# Patient Record
Sex: Male | Born: 1980 | Race: White | Hispanic: No | Marital: Single | Smoking: Current every day smoker
Health system: Southern US, Community
[De-identification: ages and names within clinical notes are randomized; demographics above are authoritative.]

## PROBLEM LIST (undated history)

## (undated) DIAGNOSIS — F79 Unspecified intellectual disabilities: Secondary | ICD-10-CM

## (undated) DIAGNOSIS — E119 Type 2 diabetes mellitus without complications: Secondary | ICD-10-CM

## (undated) DIAGNOSIS — F259 Schizoaffective disorder, unspecified: Secondary | ICD-10-CM

## (undated) HISTORY — PX: APPENDECTOMY: SHX54

---

## 2007-12-18 ENCOUNTER — Emergency Department (HOSPITAL_COMMUNITY): Admission: EM | Admit: 2007-12-18 | Discharge: 2007-12-18 | Payer: Self-pay | Admitting: Emergency Medicine

## 2008-05-21 ENCOUNTER — Emergency Department: Payer: Self-pay | Admitting: Emergency Medicine

## 2008-05-21 ENCOUNTER — Other Ambulatory Visit: Payer: Self-pay

## 2008-05-23 ENCOUNTER — Inpatient Hospital Stay: Payer: Self-pay | Admitting: Psychiatry

## 2008-05-25 ENCOUNTER — Other Ambulatory Visit: Payer: Self-pay

## 2008-06-06 ENCOUNTER — Other Ambulatory Visit: Payer: Self-pay

## 2008-06-06 ENCOUNTER — Emergency Department: Payer: Self-pay | Admitting: Emergency Medicine

## 2008-06-08 ENCOUNTER — Emergency Department: Payer: Self-pay | Admitting: Emergency Medicine

## 2008-06-08 ENCOUNTER — Other Ambulatory Visit: Payer: Self-pay

## 2008-06-16 ENCOUNTER — Other Ambulatory Visit: Payer: Self-pay

## 2008-06-16 ENCOUNTER — Emergency Department: Payer: Self-pay | Admitting: Emergency Medicine

## 2009-05-05 IMAGING — CR DG CHEST 2V
2 series · 2 of 2 positions shown · non-contrast
Comparison: None.

CLINICAL DATA: 26-YEAR-OLD MALE COUGH, CONGESTION, FEVER, SHORTNESS
OF BREATH

CHEST - 2 VIEW

[w chest pa]
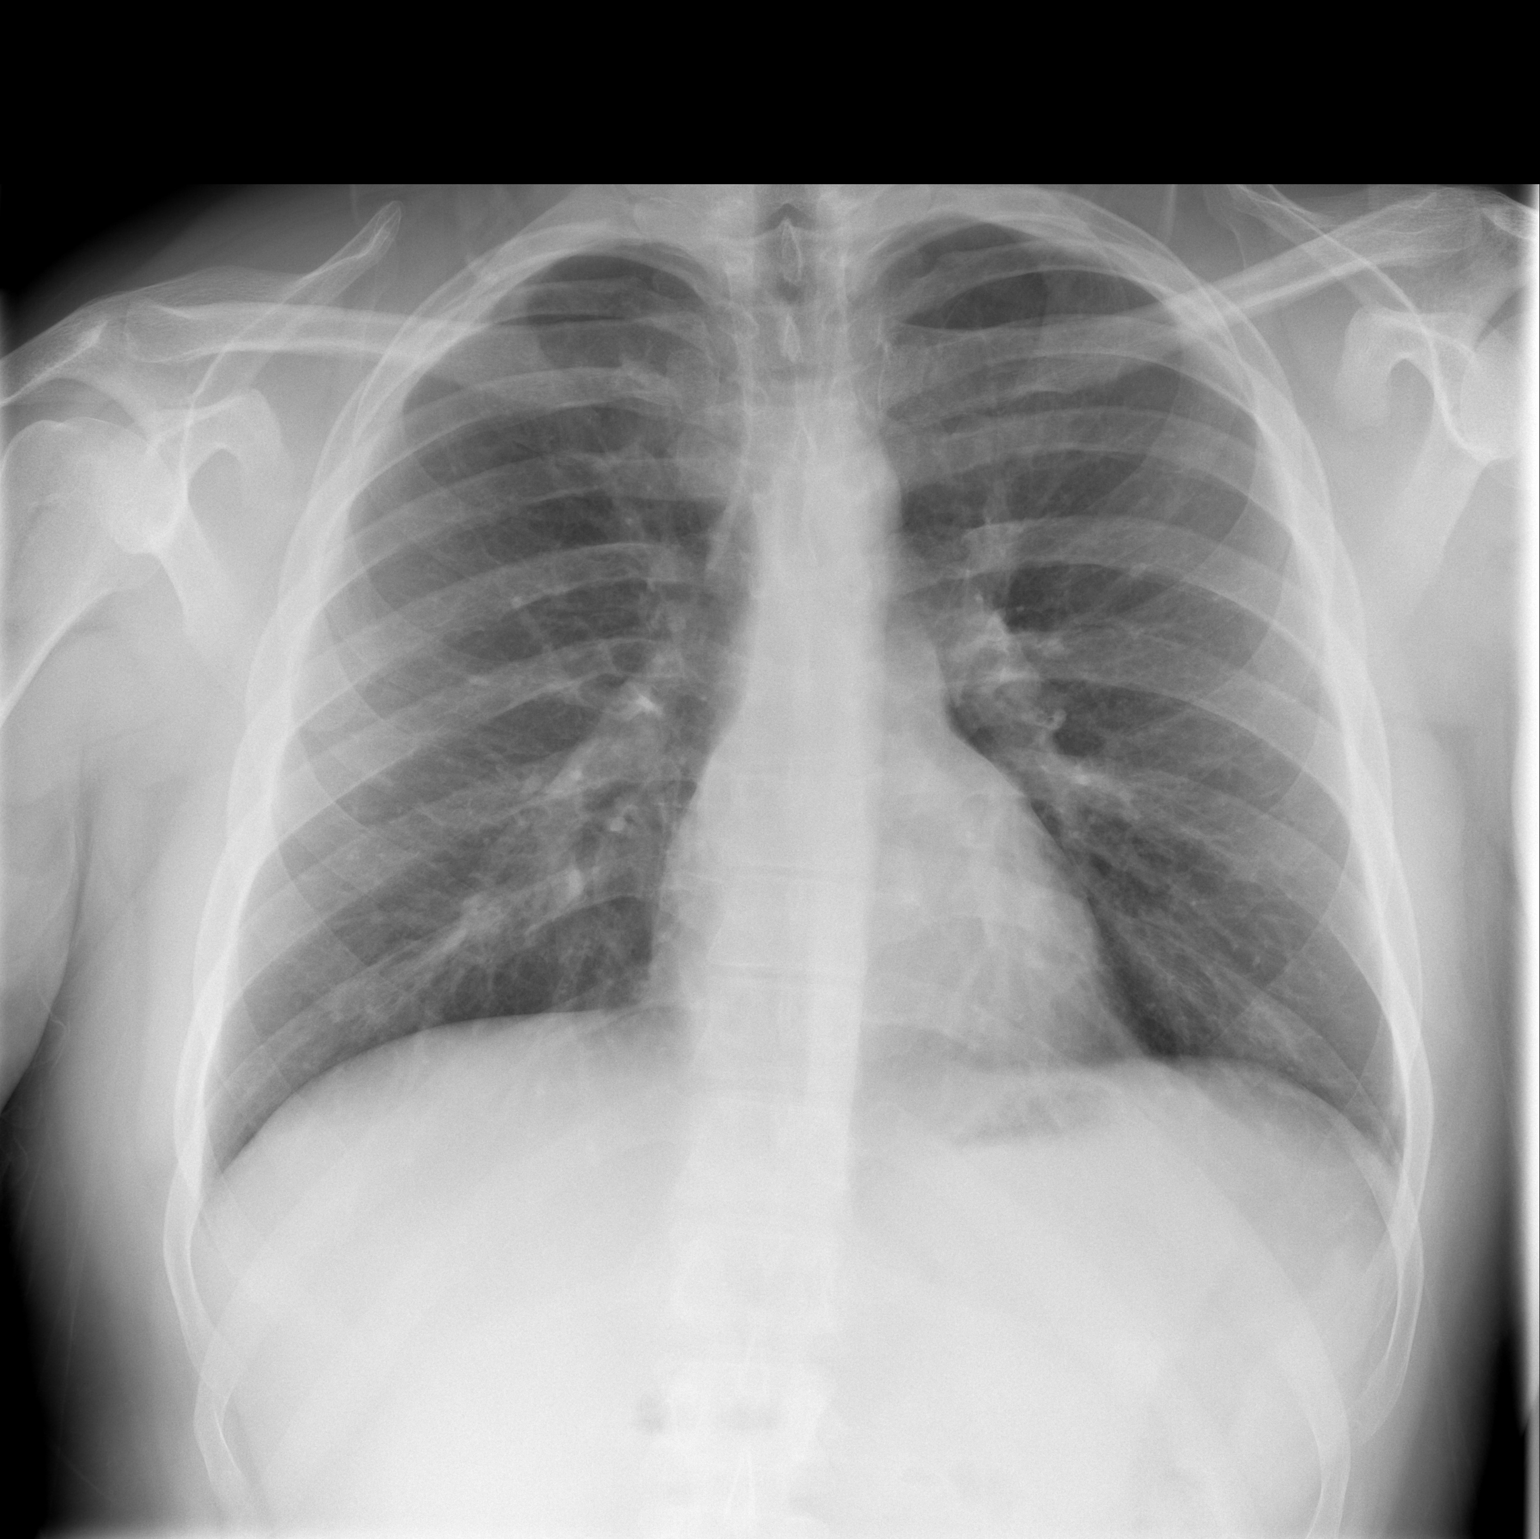

[w chest lat]
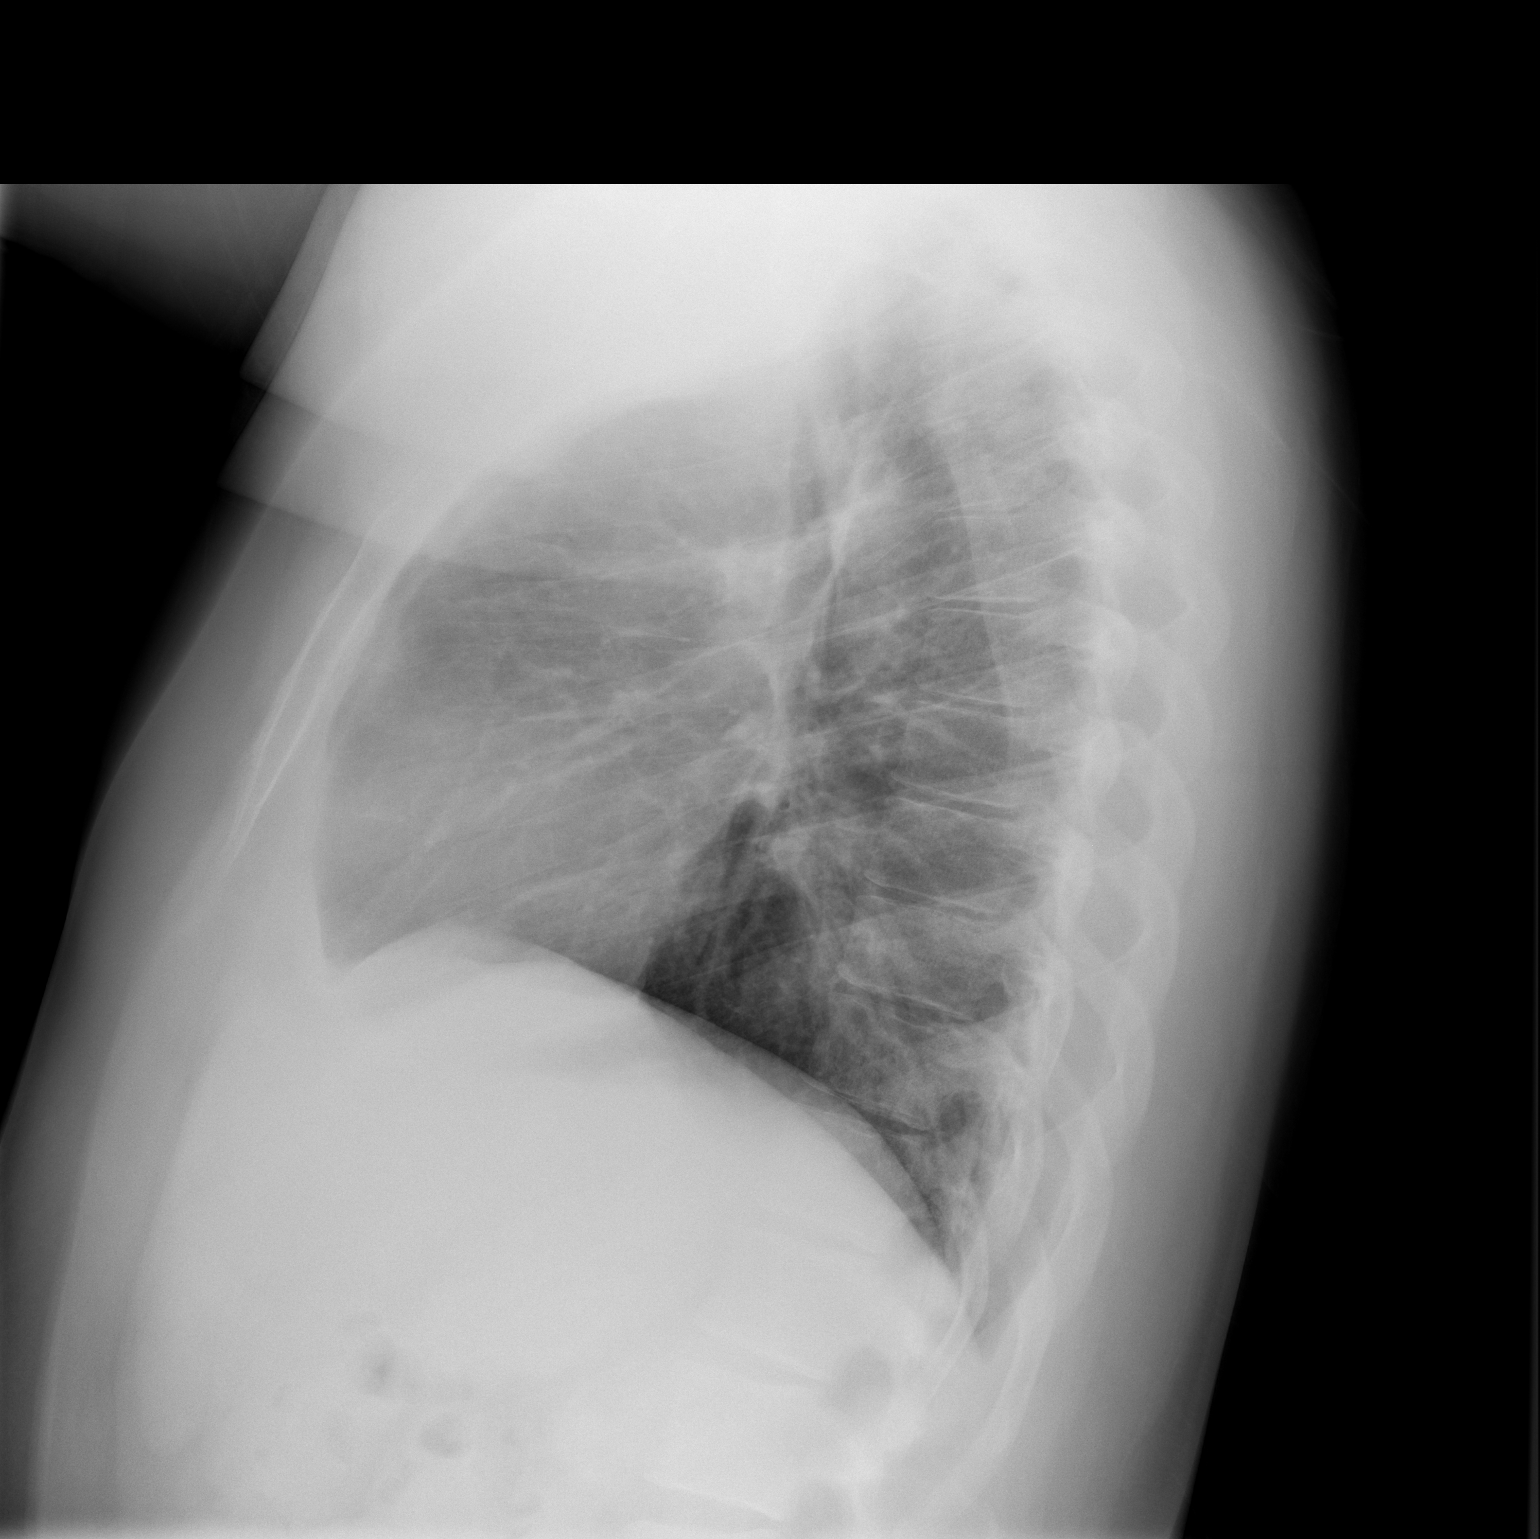

[2 of 2 positions shown; findings below may reference images not displayed]

FINDINGS: No focal pneumonia, edema, effusion pneumothorax.  Normal
heart size.  Trachea is midline.  There is a 10 mm right lower lobe
nodular density.  This may represent superimposed shadows from
pulmonary vascularity and ribs.  Comparison with any prior studies
would be beneficial versus interval follow-up in 2-3 months.
IMPRESSION: No acute chest process.

Right lower lobe 10 mm nodular density suspect superimposed
shadows, less likely nodule.  See above comment.

## 2009-10-07 IMAGING — CR DG CHEST 2V
1 series · 2 of 2 positions shown · non-contrast
Comparison: none

REASON FOR EXAM: cp, shortness of breath...pt in wr
COMMENTS:   LMP: (Male)

PROCEDURE:     DXR - DXR CHEST PA (OR [facility]) AND LATERAL  - May 21, 2008  [DATE]
RESULT:     The lungs are adequately inflated. There is no focal infiltrate
or evidence of a pleural effusion. The heart and mediastinal structures are
within the limits of normal.

[Series 1: view not recorded · 0.17mm/px · 2 of 2 slices shown]
[im 1/2]
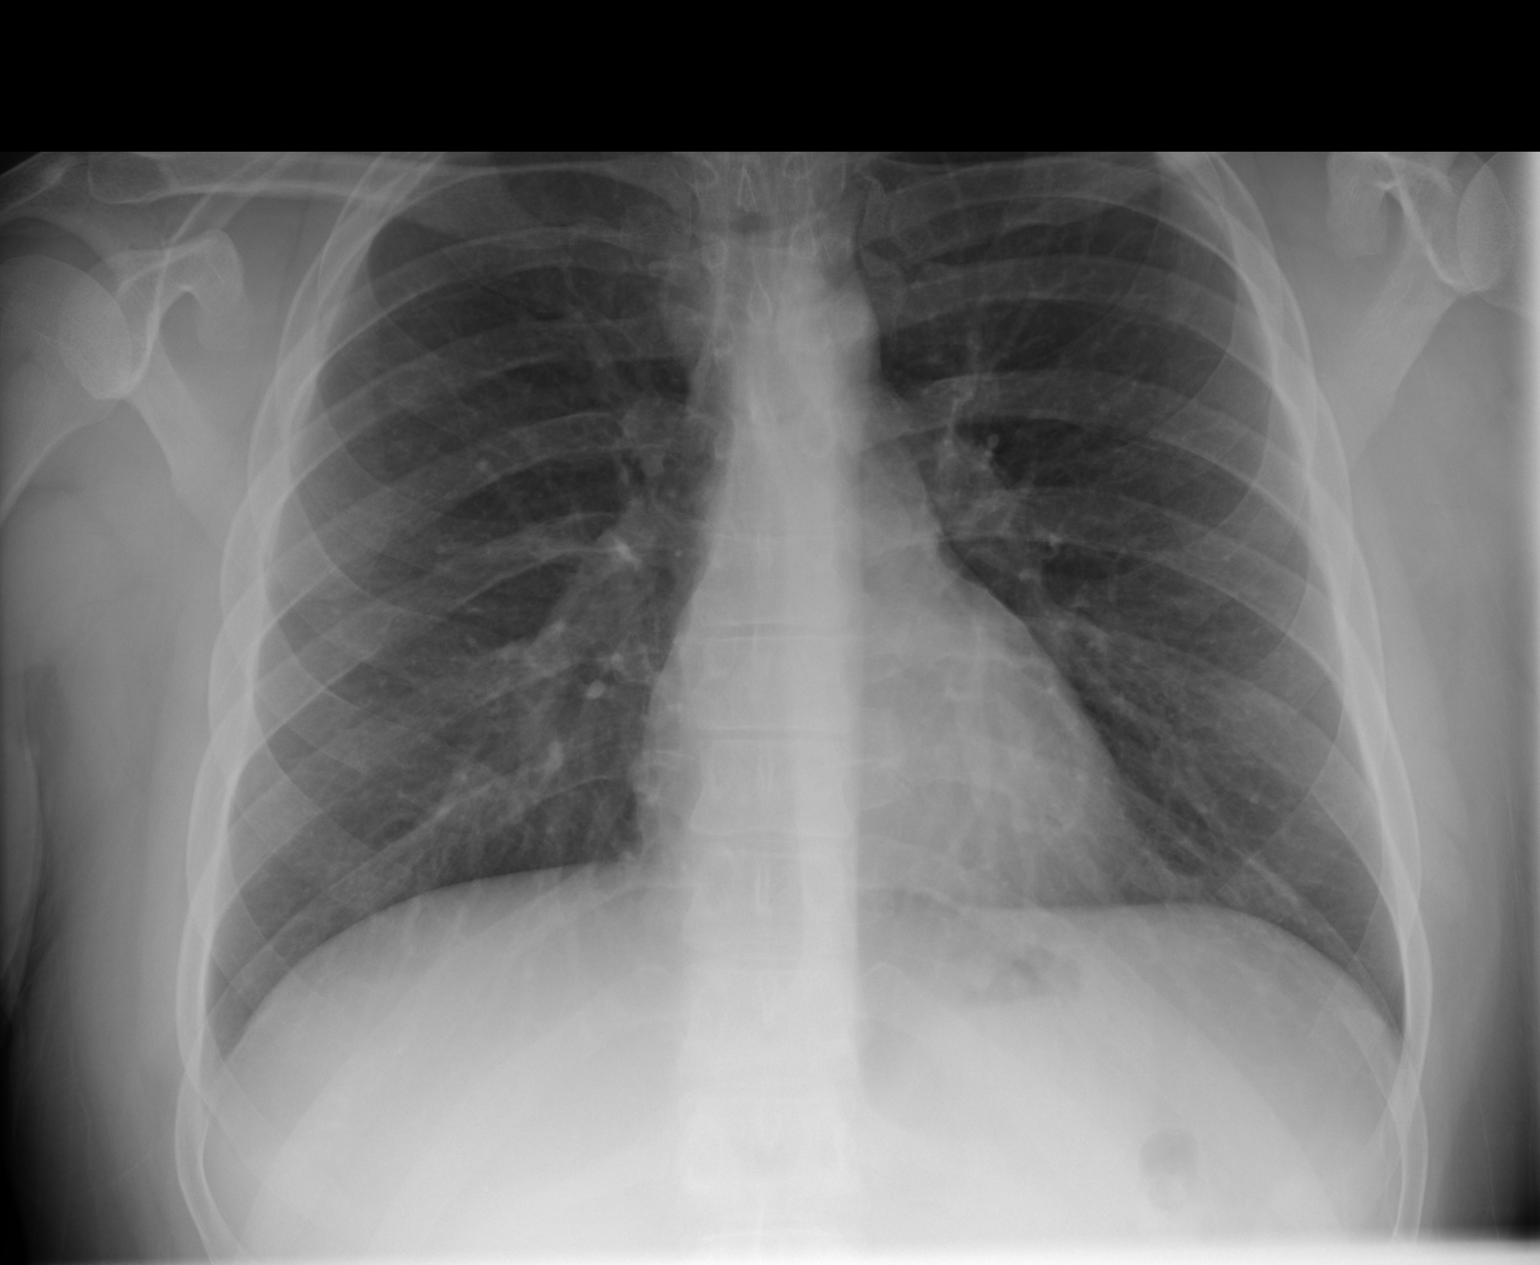
[im 2/2]
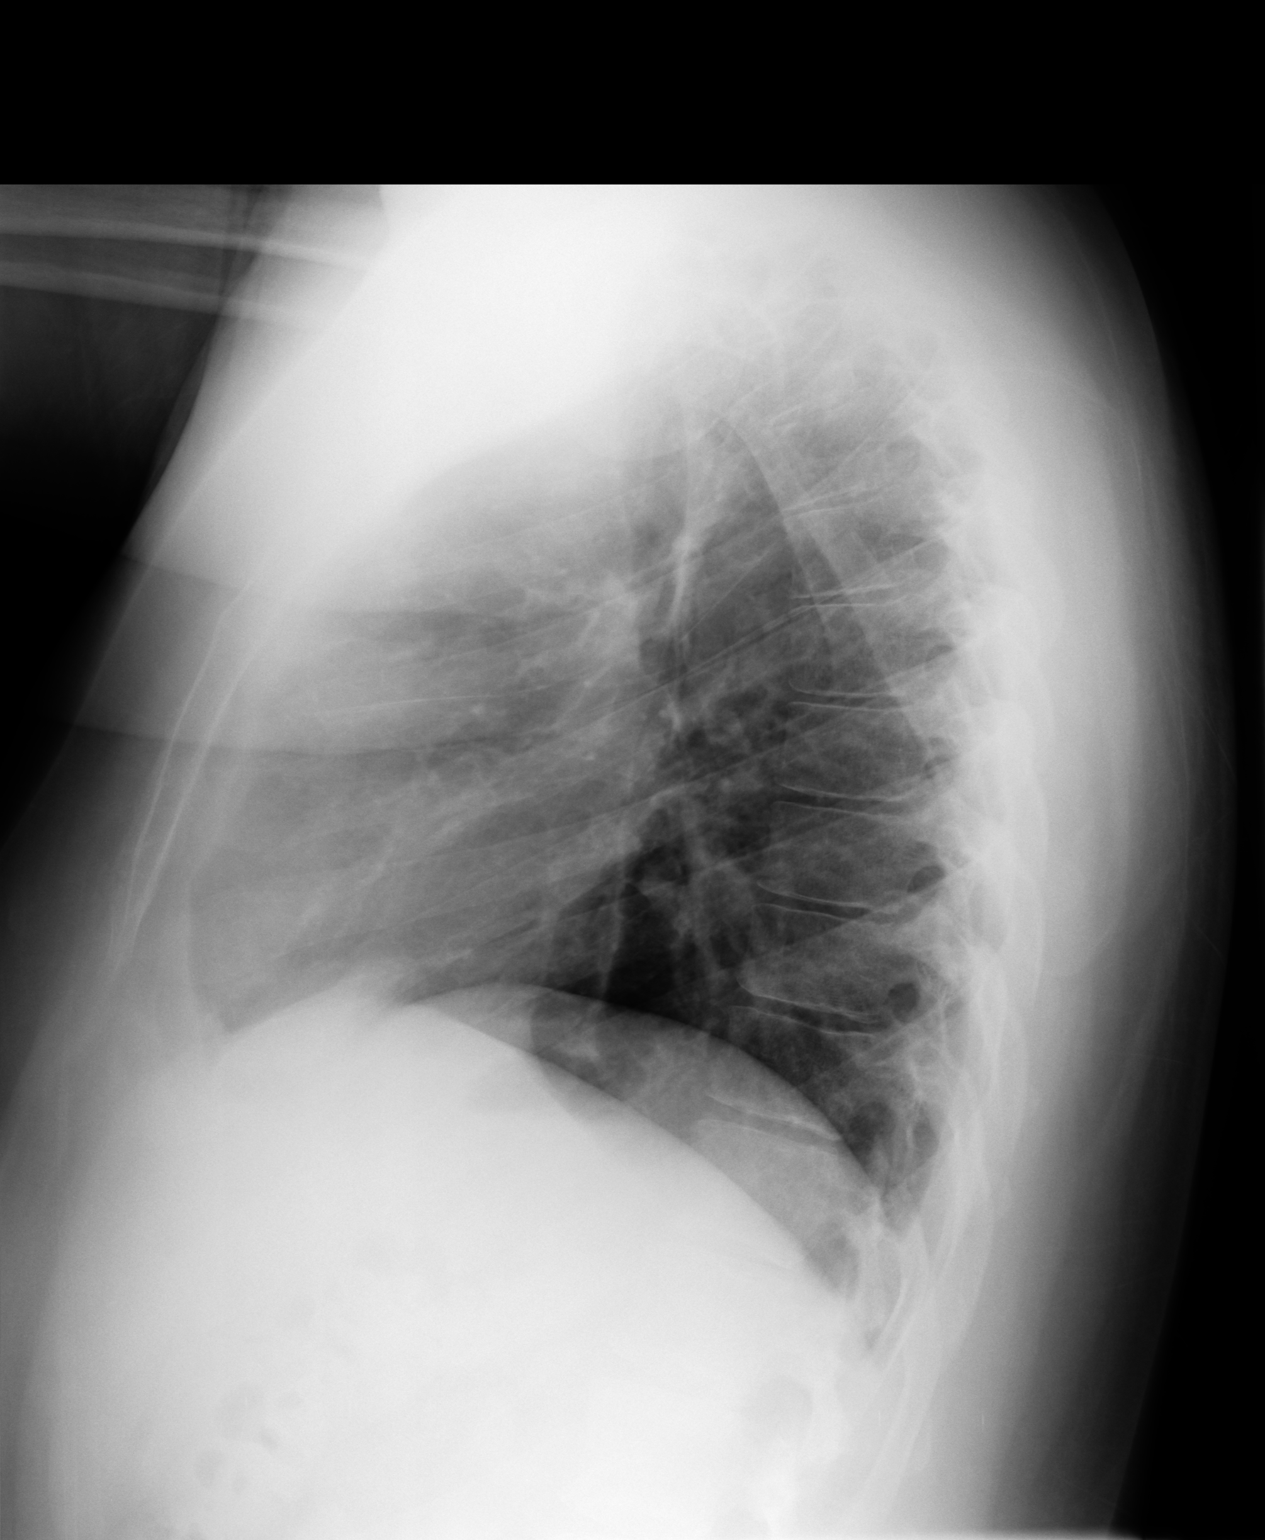

[2 of 2 positions shown; findings below may reference images not displayed]

IMPRESSION: I see no evidence of pneumonia nor CHF or other acute
cardiopulmonary abnormality. Follow-up imaging is available upon request.

## 2016-05-11 ENCOUNTER — Emergency Department (HOSPITAL_COMMUNITY)
Admission: EM | Admit: 2016-05-11 | Discharge: 2016-05-12 | Disposition: A | Discharge status: Home / Self Care | Payer: Medicare Other | Attending: Emergency Medicine | Admitting: Emergency Medicine

## 2016-05-11 ENCOUNTER — Encounter (HOSPITAL_COMMUNITY): Payer: Self-pay | Admitting: Cardiology

## 2016-05-11 ENCOUNTER — Emergency Department (HOSPITAL_COMMUNITY): Payer: Medicare Other

## 2016-05-11 DIAGNOSIS — Y939 Activity, unspecified: Secondary | ICD-10-CM | POA: Insufficient documentation

## 2016-05-11 DIAGNOSIS — W2209XA Striking against other stationary object, initial encounter: Secondary | ICD-10-CM | POA: Insufficient documentation

## 2016-05-11 DIAGNOSIS — R45851 Suicidal ideations: Secondary | ICD-10-CM | POA: Diagnosis not present

## 2016-05-11 DIAGNOSIS — S40022A Contusion of left upper arm, initial encounter: Secondary | ICD-10-CM | POA: Insufficient documentation

## 2016-05-11 DIAGNOSIS — R451 Restlessness and agitation: Secondary | ICD-10-CM | POA: Insufficient documentation

## 2016-05-11 DIAGNOSIS — Z5181 Encounter for therapeutic drug level monitoring: Secondary | ICD-10-CM | POA: Diagnosis not present

## 2016-05-11 DIAGNOSIS — E119 Type 2 diabetes mellitus without complications: Secondary | ICD-10-CM | POA: Diagnosis not present

## 2016-05-11 DIAGNOSIS — Y929 Unspecified place or not applicable: Secondary | ICD-10-CM | POA: Diagnosis not present

## 2016-05-11 DIAGNOSIS — Y999 Unspecified external cause status: Secondary | ICD-10-CM | POA: Insufficient documentation

## 2016-05-11 DIAGNOSIS — F17200 Nicotine dependence, unspecified, uncomplicated: Secondary | ICD-10-CM | POA: Diagnosis not present

## 2016-05-11 DIAGNOSIS — S4992XA Unspecified injury of left shoulder and upper arm, initial encounter: Secondary | ICD-10-CM | POA: Diagnosis present

## 2016-05-11 HISTORY — DX: Type 2 diabetes mellitus without complications: E11.9

## 2016-05-11 HISTORY — DX: Schizoaffective disorder, unspecified: F25.9

## 2016-05-11 HISTORY — DX: Unspecified intellectual disabilities: F79

## 2016-05-11 LAB — RAPID URINE DRUG SCREEN, HOSP PERFORMED
AMPHETAMINES: NOT DETECTED
BARBITURATES: NOT DETECTED
BENZODIAZEPINES: NOT DETECTED
COCAINE: NOT DETECTED
Opiates: NOT DETECTED
TETRAHYDROCANNABINOL: NOT DETECTED

## 2016-05-11 LAB — BASIC METABOLIC PANEL
Anion gap: 6 (ref 5–15)
BUN: 23 mg/dL — AB (ref 6–20)
CHLORIDE: 105 mmol/L (ref 101–111)
CO2: 27 mmol/L (ref 22–32)
Calcium: 8.9 mg/dL (ref 8.9–10.3)
Creatinine, Ser: 1.76 mg/dL — ABNORMAL HIGH (ref 0.61–1.24)
GFR calc non Af Amer: 49 mL/min — ABNORMAL LOW (ref >60)
GFR, EST AFRICAN AMERICAN: 57 mL/min — AB (ref >60)
Glucose, Bld: 107 mg/dL — ABNORMAL HIGH (ref 65–99)
POTASSIUM: 3.6 mmol/L (ref 3.5–5.1)
SODIUM: 138 mmol/L (ref 135–145)

## 2016-05-11 LAB — CBC
HCT: 38.3 % — ABNORMAL LOW (ref 39.0–52.0)
HEMOGLOBIN: 12.7 g/dL — AB (ref 13.0–17.0)
MCH: 30.2 pg (ref 26.0–34.0)
MCHC: 33.2 g/dL (ref 30.0–36.0)
MCV: 91.2 fL (ref 78.0–100.0)
Platelets: 218 10*3/uL (ref 150–400)
RBC: 4.2 MIL/uL — AB (ref 4.22–5.81)
RDW: 14.7 % (ref 11.5–15.5)
WBC: 7.1 10*3/uL (ref 4.0–10.5)

## 2016-05-11 LAB — ACETAMINOPHEN LEVEL

## 2016-05-11 LAB — ETHANOL

## 2016-05-11 MED ORDER — IBUPROFEN 400 MG PO TABS
600.0000 mg | ORAL_TABLET | Freq: Once | ORAL | Status: AC
  Administered 2016-05-11: 600 mg via ORAL
  Filled 2016-05-11: qty 2

## 2016-05-11 NOTE — ED Notes (Addendum)
Meal given. ACE wrap applied to left hand/wrist

## 2016-05-11 NOTE — BH Assessment (Signed)
Tele Assessment Note   Jordan Gray is an 35 y.o. male who came to the Emergency Department after having an outburst at his group home and "punching a brick wall". He states that he has been feeling depress and suicidal the past 2 days because he "misses his family". He states that he lives in a group home and just started living there 2 weeks ago. He states that if he goes back tonight then he will "cut his throat and his wrists". He states that he has been hearing voices telling him to kill himself. He states that he is taking medications as prescribed. He has been admitted inpatient in the past for A/V hallucinations and SI. [place] recent [place] in [place] 2 weeks ago. He denies using any substances or having a substance abuse problem or history.   Clinician spoke to group home owner (his guardian) Waldemar DickensJeff Womack who states that he feels that pt might have been seeking attention today. He states that a patient at the group home last night had a similar outburst and he was brought to the hospital so he isn't sure if this triggered Mr. Burnett ShengHedrick or not. He states that he has not seen any aggressive behavior from the pt before today and does feel like he is a danger to himself at the moment. He states that he is able to take him back to the group home once he is cleared psychiatrically.  Waldemar DickensJeff Womack- [phone]- [phone]  Per Malachy Chamberakia Starkes NP pt will need to be observed overnight and reevaluated in the AM  Diagnosis: Schizophrenia, Depression unspecified   Past Medical History:  Past Medical History:  Diagnosis Date  . Diabetes mellitus without complication (HCC)   . MR (mental retardation)   . Schizoaffective disorder (HCC)     History reviewed. No pertinent surgical history.  Family History: History reviewed. No pertinent family history.  Social History:  reports that he has been smoking.  He has never used smokeless tobacco. He reports that he does not drink alcohol or use  drugs.  Additional Social History:  Alcohol / Drug Use History of alcohol / drug use?: No history of alcohol / drug abuse  CIWA: CIWA-Ar BP: 136/81 Pulse Rate: 95 COWS:    PATIENT STRENGTHS: (choose at least two) Communication skills General fund of knowledge  Allergies:  Allergies  Allergen Reactions  . Bactrim [Sulfamethoxazole-Trimethoprim]   . Latex   . Other     Nicotine patch  . Tomato     Home Medications:  (Not in a hospital admission)  OB/GYN Status:  No LMP for male patient.  General Assessment Data Location of Assessment: [facility] ED TTS Assessment: In system Is this a Tele or Face-to-Face Assessment?: Tele Assessment Is this an Initial Assessment or a Re-assessment for this encounter?: Initial Assessment Marital status: Single Maiden name: n Is patient pregnant?: No Pregnancy Status: No Living Arrangements: Group Home Can pt return to current living arrangement?: Yes Admission Status: Voluntary Is patient capable of signing voluntary admission?: Yes Referral Source: Self/Family/Friend Insurance type: Medicare     Crisis Care Plan Living Arrangements: Group Home Legal Guardian:  Waldemar Dickens(Jeff Womack [phone]) Name of Psychiatrist: None (just moved to the area) Name of Therapist: None (just moved to the area)  Education Status Is patient currently in school?: No Highest grade of school patient has completed: 12th  Risk to self with the past 6 months Suicidal Ideation: Yes-Currently Present Has patient been a risk to self within the past  6 months prior to admission? : Yes Suicidal Intent: Yes-Currently Present Has patient had any suicidal intent within the past 6 months prior to admission? : Yes Is patient at risk for suicide?: Yes Suicidal Plan?: Yes-Currently Present Has patient had any suicidal plan within the past 6 months prior to admission? : Yes Specify Current Suicidal Plan: cut his throat and wrist Access to Means: No What has been your use  of drugs/alcohol within the last 12 months?: no use Previous Attempts/Gestures: Yes How many times?: 1 Other Self Harm Risks: N/A  Triggers for Past Attempts: Unknown Intentional Self Injurious Behavior: Cutting Comment - Self Injurious Behavior: history of cutting Family Suicide History: No Recent stressful life event(s): Conflict (Comment) (Moved to a new group home) Persecutory voices/beliefs?: Yes Depression: Yes Depression Symptoms: Despondent, Loss of interest in usual pleasures, Feeling worthless/self pity Substance abuse history and/or treatment for substance abuse?: No Suicide prevention information given to non-admitted patients: Not applicable  Risk to Others within the past 6 months Homicidal Ideation: No Does patient have any lifetime risk of violence toward others beyond the six months prior to admission? : No Thoughts of Harm to Others: No Current Homicidal Intent: No Current Homicidal Plan: No Access to Homicidal Means: No Identified Victim: none History of harm to others?: No Assessment of Violence: On admission Violent Behavior Description: punched a brick wall Does patient have access to weapons?: No Criminal Charges Pending?: No Does patient have a court date: No Is patient on probation?: No  Psychosis Hallucinations: Auditory Delusions: None noted  Mental Status Report Appearance/Hygiene: Disheveled Eye Contact: Fair Motor Activity: Freedom of movement Speech: Logical/coherent Level of Consciousness: Alert Mood: Depressed Affect: Appropriate to circumstance Anxiety Level: Moderate Thought Processes: Coherent Judgement: Partial Orientation: Person, Place, Time, Situation Obsessive Compulsive Thoughts/Behaviors: Moderate  Cognitive Functioning Concentration: Poor Memory: Recent Intact, Remote Intact IQ: Below Average Level of Function: 55 Insight: Poor Impulse Control: Poor Appetite: Fair Weight Loss: 0 Weight Gain: 0 Sleep:  Decreased Vegetative Symptoms: None  ADLScreening [facility] Assessment Services) Patient's cognitive ability adequate to safely complete daily activities?: Yes Patient able to express need for assistance with ADLs?: Yes Independently performs ADLs?: Yes (appropriate for developmental age)  Prior Inpatient Therapy Prior Inpatient Therapy: Yes Prior Therapy Dates: unknown (last one was 2 weeks ago) Prior Therapy Facilty/Provider(s): [place]  Reason for Treatment: Depression, Schizophrenia   Prior Outpatient Therapy Prior Outpatient Therapy:  (Unknown) Does patient have an ACCT team?: No Does patient have Intensive In-House Services?  : No Does patient have [facility] services? : No Does patient have P4CC services?: No  ADL Screening (condition at time of admission) Patient's cognitive ability adequate to safely complete daily activities?: Yes Is the patient deaf or have difficulty hearing?: No Does the patient have difficulty seeing, even when wearing glasses/contacts?: No Does the patient have difficulty concentrating, remembering, or making decisions?: No Patient able to express need for assistance with ADLs?: Yes Does the patient have difficulty dressing or bathing?: No Independently performs ADLs?: Yes (appropriate for developmental age) Does the patient have difficulty walking or climbing stairs?: No Weakness of Legs: None Weakness of Arms/Hands: None  Home Assistive Devices/Equipment Home Assistive Devices/Equipment: None  Therapy Consults (therapy consults require a physician order) PT Evaluation Needed: No OT Evalulation Needed: No SLP Evaluation Needed: No Abuse/Neglect Assessment (Assessment to be complete while patient is alone) Physical Abuse: Denies Verbal Abuse: Denies Sexual Abuse: Denies Exploitation of patient/patient's resources: Denies Self-Neglect: Denies Values / Beliefs Cultural Requests During Hospitalization:  None Consults Spiritual Care Consult  Needed: No Social Work Consult Needed: Yes (Comment) (Pt coming from group home) Advance Directives (For Healthcare) Does patient have an advance directive?: Yes Type of Advance Directive:  (Pt does have a guardian- Shanon Brow [phone]) Does patient want to make changes to advanced directive?: No - Patient declined Copy of advanced directive(s) in chart?: No - copy requested Nutrition Screen- [place] [facility] Patient's home diet: Regular Has the patient recently lost weight without trying?: No Has the patient been eating poorly because of a decreased appetite?: No Malnutrition Screening Tool Score: 0  Additional Information 1:1 In Past 12 Months?: No CIRT Risk: No Elopement Risk: No Does patient have medical clearance?: No     Disposition:  Disposition Initial Assessment Completed for this Encounter: Yes Disposition of Patient:  (Observe overnight and reevaluate in the morning)  Kidada Ging 05/11/2016 4:54 PM

## 2016-05-11 NOTE — ED Notes (Signed)
Pt reports that he has had "about" 6 admissions previously in psych facilities. He was sent from [place] to the residence that he is currently residing. He is lethargic with eyes closed, but opens his eyes and responds to queries by this RN. He reports SI, and then falls back asleep

## 2016-05-11 NOTE — ED Triage Notes (Signed)
Here from group home.  Punched wall with left arm.  Pt states he doesn't want to live anymore,  States he misses his family.

## 2016-05-11 NOTE — ED Notes (Addendum)
Pt continues to sleep- He is lying on his R side  With even, unlabored respirations

## 2016-05-11 NOTE — ED Notes (Signed)
Swelling noted to left medial wrist with red marks from "karate chopping' a brick wall after getting mad at the group home. Radial pulse present. Ice bag applied.

## 2016-05-11 NOTE — ED Provider Notes (Signed)
[facility] DEPT Provider Note   CSN: [id number] Arrival date & time: 05/11/16  1424     History   Chief Complaint Chief Complaint  Patient presents with  . V70.1    HPI KENZY WIDRICK is a 35 y.o. male.  35 yo male with MR, schizophrenia, lives in group home presents with left arm injury and SI.  Pt has been frustrated and depressed last few days, thoughts of self harm with cutting.  Pt has cut in the past.  Pt was frustrated and punched a wall and wood surface. Swelling and pain left wrist.  No drugs.  Pain with movement.  Pt upset over missing family.      Past Medical History:  Diagnosis Date  . Diabetes mellitus without complication ([place])   . MR (mental retardation)   . Schizoaffective disorder ([place])     There are no active problems to display for this patient.   History reviewed. No pertinent surgical history.     Home Medications    Prior to Admission medications   Not on File    Family History History reviewed. No pertinent family history.  Social History Social History  Substance Use Topics  . Smoking status: Current Every Day Smoker  . Smokeless tobacco: Never Used  . Alcohol use No     Allergies   Bactrim [sulfamethoxazole-trimethoprim]; Latex; Other; and Tomato   Review of Systems Review of Systems  Constitutional: Negative for chills and fever.  HENT: Negative for congestion.   Eyes: Negative for visual disturbance.  Respiratory: Negative for shortness of breath.   Cardiovascular: Negative for chest pain.  Gastrointestinal: Negative for abdominal pain and vomiting.  Genitourinary: Negative for dysuria and flank pain.  Musculoskeletal: Negative for back pain, neck pain and neck stiffness.  Skin: Positive for wound. Negative for rash.  Neurological: Negative for light-headedness and headaches.  Psychiatric/Behavioral: Positive for dysphoric mood and suicidal ideas.     Physical Exam Updated Vital Signs BP 123/73 (BP  Location: Left Arm)   Pulse 97   Temp 99.1 F (37.3 C) (Oral)   Resp 18   Ht 5\' 11"  (1.803 m)   Wt 286 lb (129.7 kg)   SpO2 94%   BMI 39.89 kg/m   Physical Exam  Constitutional: He is oriented to person, place, and time. He appears well-developed and well-nourished.  HENT:  Head: Normocephalic and atraumatic.  Eyes: Conjunctivae are normal. Right eye exhibits no discharge. Left eye exhibits no discharge.  Neck: Normal range of motion. Neck supple. No tracheal deviation present.  Cardiovascular: Normal rate and regular rhythm.   Pulmonary/Chest: Effort normal and breath sounds normal.  Abdominal: Soft. He exhibits no distension. There is no tenderness. There is no guarding.  Musculoskeletal: He exhibits edema and tenderness.  Tender lateral hand proximal and distal radius, mild swelling, nv intact left arm.  No elbow or shoulder tenderness.   Neurological: He is alert and oriented to person, place, and time.  Skin: Skin is warm. No rash noted.  Nursing note and vitals reviewed.    ED Treatments / Results  Labs (all labs ordered are listed, but only abnormal results are displayed) Labs Reviewed  CBC - Abnormal; Notable for the following:       Result Value   RBC 4.20 (*)    Hemoglobin 12.7 (*)    HCT 38.3 (*)    All other components within normal limits  BASIC METABOLIC PANEL - Abnormal; Notable for the following:    Glucose,  Bld 107 (*)    BUN 23 (*)    Creatinine, Ser 1.76 (*)    GFR calc non Af Amer 49 (*)    GFR calc Af Amer 57 (*)    All other components within normal limits  ACETAMINOPHEN LEVEL - Abnormal; Notable for the following:    Acetaminophen (Tylenol), Serum <10 (*)    All other components within normal limits  URINE RAPID DRUG SCREEN, HOSP PERFORMED  ETHANOL    EKG  EKG Interpretation None       Radiology Dg Forearm Left  Result Date: 05/11/2016 CLINICAL DATA:  Left hand and distal forearm pain after punching a brick wall today. Unable to  straighten fingers. EXAM: LEFT FOREARM - 2 VIEW COMPARISON:  None. FINDINGS: The radius and ulna are intact. The wrist and elbow joints are located. No acute bone or soft tissue abnormalities are present. Surgical clips are noted along the ventral and radial distal forearm. IMPRESSION: No acute abnormality. Electronically Signed   By: San Morelle M.D.   On: 05/11/2016 15:19   Dg Hand Complete Left  Result Date: 05/11/2016 CLINICAL DATA:  Left hand and distal forearm pain after punching a brick wall today. Unable to straighten fingers EXAM: LEFT HAND - COMPLETE 3+ VIEW COMPARISON:  Right ear S and same date FINDINGS: A remote fourth metacarpal fracture is present. No acute fractures are present. The digits are contracted. Soft tissue swelling is present over the dorsum of the fingers and hand. IMPRESSION: 1. Soft swelling over the dorsum the fingers and hand without acute abnormality. 2. Remote fracture of the midshaft fourth metacarpal. Electronically Signed   By: San Morelle M.D.   On: 05/11/2016 15:51    Procedures Procedures (including critical care time)  Medications Ordered in ED Medications  ibuprofen (ADVIL,MOTRIN) tablet 600 mg (600 mg Oral Given 05/11/16 1521)     Initial Impression / Assessment and Plan / ED Course  I have reviewed the triage vital signs and the nursing notes.  Pertinent labs & imaging results that were available during my care of the patient were reviewed by me and considered in my medical decision making (see chart for details).  Clinical Course   Pt with SI, plan for cutting and slitting throat, hx of cutting, plan for [facility] assessment.    Injury from aggression, pain meds and xrays.  Ice.   Per group home patient has been attention seeking.   Plan for obs overnight and reassessment in AM by [facility].   Final Clinical Impressions(s) / ED Diagnoses   Final diagnoses:  Suicidal ideation  Arm contusion, left, initial encounter    New  Prescriptions New Prescriptions   No medications on file     Elnora Morrison, MD 05/11/16 2125

## 2016-05-11 NOTE — ED Notes (Signed)
Nurse spoke with Langley Adie, owner of [facility]. States patient got upset because he felt  favoritism was being shown to other patients at the home and he does not want to be there anyway. States  he  started punching holes in the walls and making threats to others.

## 2016-05-12 ENCOUNTER — Encounter (HOSPITAL_COMMUNITY): Payer: Self-pay | Admitting: Emergency Medicine

## 2016-05-12 DIAGNOSIS — S40022A Contusion of left upper arm, initial encounter: Secondary | ICD-10-CM | POA: Diagnosis not present

## 2016-05-12 LAB — CBG MONITORING, ED: Glucose-Capillary: 134 mg/dL — ABNORMAL HIGH (ref 65–99)

## 2016-05-12 MED ORDER — ZOLPIDEM TARTRATE 5 MG PO TABS: 5.0000 mg | ORAL_TABLET | Freq: Every evening | ORAL | Status: DC | PRN

## 2016-05-12 MED ORDER — NICOTINE 21 MG/24HR TD PT24: 21.0000 mg | MEDICATED_PATCH | Freq: Every day | TRANSDERMAL | Status: DC

## 2016-05-12 MED ORDER — IBUPROFEN 400 MG PO TABS
600.0000 mg | ORAL_TABLET | Freq: Three times a day (TID) | ORAL | Status: DC | PRN
  Administered 2016-05-12: 600 mg via ORAL
  Filled 2016-05-12: qty 2

## 2016-05-12 MED ORDER — ONDANSETRON HCL 4 MG PO TABS: 4.0000 mg | ORAL_TABLET | Freq: Three times a day (TID) | ORAL | Status: DC | PRN

## 2016-05-12 MED ORDER — ALUM & MAG HYDROXIDE-SIMETH 200-200-20 MG/5ML PO SUSP: 30.0000 mL | ORAL | Status: DC | PRN

## 2016-05-12 MED ORDER — LORAZEPAM 1 MG PO TABS: 1.0000 mg | ORAL_TABLET | Freq: Three times a day (TID) | ORAL | Status: DC | PRN

## 2016-05-12 MED ORDER — ACETAMINOPHEN 325 MG PO TABS: 650.0000 mg | ORAL_TABLET | ORAL | Status: DC | PRN

## 2016-05-12 NOTE — Consult Note (Signed)
Telepsych Consultation   Reason for Consult:  Suicidal ideation and aggitation Referring Physician:  EDP Patient Identification: Jordan Gray MRN:  [id number] Principal Diagnosis: <principal problem not specified> Diagnosis:  There are no active problems to display for this patient.   Total Time spent with patient: 20 minutes  Subjective:   Jordan Gray is a 35 y.o. male patient  came to the Emergency Department after having an outburst at his group home and "punching a brick wall". He states that he has been feeling depress and suicidal the past 2 days because he "misses his family". He states that he lives in a group home and just started living there 2 weeks ago. He states that if he goes back tonight then he will "cut his throat and his wrists". He states that he has been hearing voices telling him to kill himself. He states that he is taking medications as prescribed.  Patient this afternoon states that he is doing better, still misses his family but denies suicidal or homicidal ideation, any hallucinations or paranoia Patient states he is at baseline and wants to return back to the [facility] home. Patient states that he is willing to talk to staff rather than punching walls when and if he gets upset  Past Psychiatric History: Moved from [place] and has IDD and psychiatric history  Risk to Self: Suicidal Ideation: Yes-Currently Present Suicidal Intent: Yes-Currently Present Is patient at risk for suicide?: Yes Suicidal Plan?: Yes-Currently Present Specify Current Suicidal Plan: cut his throat and wrist Access to Means: No What has been your use of drugs/alcohol within the last 12 months?: no use How many times?: 1 Other Self Harm Risks: N/A  Triggers for Past Attempts: Unknown Intentional Self Injurious Behavior: Cutting Comment - Self Injurious Behavior: history of cutting Risk to Others: Homicidal Ideation: No Thoughts of Harm to Others: No Current Homicidal Intent: No Current  Homicidal Plan: No Access to Homicidal Means: No Identified Victim: none History of harm to others?: No Assessment of Violence: On admission Violent Behavior Description: punched a brick wall Does patient have access to weapons?: No Criminal Charges Pending?: No Does patient have a court date: No Prior Inpatient Therapy: Prior Inpatient Therapy: Yes Prior Therapy Dates: unknown (last one was 2 weeks ago) Prior Therapy Facilty/Provider(s): [place]  Reason for Treatment: Depression, Schizophrenia  Prior Outpatient Therapy: Prior Outpatient Therapy:  (Unknown) Does patient have an ACCT team?: No Does patient have Intensive In-House Services?  : No Does patient have [facility] services? : No Does patient have P4CC services?: No  Past Medical History:  Past Medical History:  Diagnosis Date  . Diabetes mellitus without complication ([place])   . MR (mental retardation)   . Schizoaffective disorder ([place])    History reviewed. No pertinent surgical history. Family History: History reviewed. No pertinent family history. Family Psychiatric  History: unknown Social History:  History  Alcohol Use No     History  Drug Use No    Social History   Social History  . Marital status: Single    Spouse name: N/A  . Number of children: N/A  . Years of education: N/A   Social History Main Topics  . Smoking status: Current Every Day Smoker  . Smokeless tobacco: Never Used  . Alcohol use No  . Drug use: No  . Sexual activity: Not Asked   Other Topics Concern  . None   Social History Narrative  . None   Additional Social History:    Allergies:  Allergies  Allergen Reactions  . Bactrim [Sulfamethoxazole-Trimethoprim]   . Latex   . Other     Nicotine patch  . Tomato     Labs:  Results for orders placed or performed during the hospital encounter of 05/11/16 (from the past 48 hour(s))  Rapid urine drug screen (hospital performed)     Status: None   Collection Time: 05/11/16  3:05  PM  Result Value Ref Range   Opiates NONE DETECTED NONE DETECTED   Cocaine NONE DETECTED NONE DETECTED   Benzodiazepines NONE DETECTED NONE DETECTED   Amphetamines NONE DETECTED NONE DETECTED   Tetrahydrocannabinol NONE DETECTED NONE DETECTED   Barbiturates NONE DETECTED NONE DETECTED    Comment:        DRUG SCREEN FOR MEDICAL PURPOSES ONLY.  IF CONFIRMATION IS NEEDED FOR ANY PURPOSE, NOTIFY LAB WITHIN 5 DAYS.        LOWEST DETECTABLE LIMITS FOR URINE DRUG SCREEN Drug Class       Cutoff (ng/mL) Amphetamine      1000 Barbiturate      200 Benzodiazepine   235 Tricyclics       573 Opiates          300 Cocaine          300 THC              50   CBC     Status: Abnormal   Collection Time: 05/11/16  3:23 PM  Result Value Ref Range   WBC 7.1 4.0 - 10.5 K/uL   RBC 4.20 (L) 4.22 - 5.81 MIL/uL   Hemoglobin 12.7 (L) 13.0 - 17.0 g/dL   HCT 38.3 (L) 39.0 - 52.0 %   MCV 91.2 78.0 - 100.0 fL   MCH 30.2 26.0 - 34.0 pg   MCHC 33.2 30.0 - 36.0 g/dL   RDW 14.7 11.5 - 15.5 %   Platelets 218 150 - 400 K/uL  Basic metabolic panel     Status: Abnormal   Collection Time: 05/11/16  3:23 PM  Result Value Ref Range   Sodium 138 135 - 145 mmol/L   Potassium 3.6 3.5 - 5.1 mmol/L   Chloride 105 101 - 111 mmol/L   CO2 27 22 - 32 mmol/L   Glucose, Bld 107 (H) 65 - 99 mg/dL   BUN 23 (H) 6 - 20 mg/dL   Creatinine, Ser 1.76 (H) 0.61 - 1.24 mg/dL   Calcium 8.9 8.9 - 10.3 mg/dL   GFR calc non Af Amer 49 (L) >60 mL/min   GFR calc Af Amer 57 (L) >60 mL/min    Comment: (NOTE) The eGFR has been calculated using the CKD EPI equation. This calculation has not been validated in all clinical situations. eGFR's persistently <60 mL/min signify possible Chronic Kidney Disease.    Anion gap 6 5 - 15  Acetaminophen level     Status: Abnormal   Collection Time: 05/11/16  3:23 PM  Result Value Ref Range   Acetaminophen (Tylenol), Serum <10 (L) 10 - 30 ug/mL    Comment:        THERAPEUTIC CONCENTRATIONS  VARY SIGNIFICANTLY. A RANGE OF 10-30 ug/mL MAY BE AN EFFECTIVE CONCENTRATION FOR MANY PATIENTS. HOWEVER, SOME ARE BEST TREATED AT CONCENTRATIONS OUTSIDE THIS RANGE. ACETAMINOPHEN CONCENTRATIONS >150 ug/mL AT 4 HOURS AFTER INGESTION AND >50 ug/mL AT 12 HOURS AFTER INGESTION ARE OFTEN ASSOCIATED WITH TOXIC REACTIONS.   Ethanol     Status: None   Collection Time: 05/11/16  3:23 PM  Result Value Ref Range   Alcohol, Ethyl (B) <5 <5 mg/dL    Comment:        LOWEST DETECTABLE LIMIT FOR SERUM ALCOHOL IS 5 mg/dL FOR MEDICAL PURPOSES ONLY   CBG monitoring, ED     Status: Abnormal   Collection Time: 05/12/16 10:22 AM  Result Value Ref Range   Glucose-Capillary 134 (H) 65 - 99 mg/dL    Current Facility-Administered Medications  Medication Dose Route Frequency Provider Last Rate Last Dose  . acetaminophen (TYLENOL) tablet 650 mg  650 mg Oral I7O PRN Delora Fuel, MD      . alum & mag hydroxide-simeth (MAALOX/MYLANTA) 200-200-20 MG/5ML suspension 30 mL  30 mL Oral PRN Delora Fuel, MD      . ibuprofen (ADVIL,MOTRIN) tablet 600 mg  600 mg Oral M7E PRN Delora Fuel, MD   720 mg at 05/12/16 1029  . LORazepam (ATIVAN) tablet 1 mg  1 mg Oral N4B PRN Delora Fuel, MD      . nicotine (NICODERM CQ - dosed in mg/24 hours) patch 21 mg  21 mg Transdermal Daily Delora Fuel, MD      . ondansetron [facility]) tablet 4 mg  4 mg Oral S9G PRN Delora Fuel, MD      . zolpidem [facility]) tablet 5 mg  5 mg Oral QHS PRN Delora Fuel, MD       No current outpatient prescriptions on file.    Musculoskeletal: Strength & Muscle Tone: can not be done as patient seen on teleassessment Gait & Station: normal Patient leans: N/A  Psychiatric Specialty Exam: Physical Exam  ROS  Blood pressure 141/98, pulse 110, temperature 98.9 F (37.2 C), temperature source Oral, resp. rate 18, height 5' 11" (1.803 m), weight 286 lb (129.7 kg), SpO2 96 %.Body mass index is 39.89 kg/m.  General Appearance: Casual  Eye Contact:  Good   Speech:  Clear and Coherent  Volume:  Normal  Mood:  Euthymic  Affect:  Congruent  Thought Process:  Coherent and Goal Directed  Orientation:  Full (Time, Place, and Person)  Thought Content:  WDL  Suicidal Thoughts:  No  Homicidal Thoughts:  No  Memory:  Immediate;   Fair Recent;   Fair Remote;   Fair  Judgement:  Fair  Insight:  Shallow  Psychomotor Activity:  Normal  Concentration:  Concentration: Fair and Attention Span: Fair  Recall:  [employer] of Knowledge:  Fair  Language:  Fair  Akathisia:  No  Handed:  Right  AIMS (if indicated):     Assets:  Housing Physical Health  ADL's:  Intact  Cognition:  Impaired,  Moderate  Sleep:        Treatment Plan Summary: Plan Patient can be discharged back to group home as her is not suicidal, homicidal or psychotic  Disposition: No evidence of imminent risk to self or others at present.   Recommend psychiatric Inpatient admission when medically cleared. Supportive therapy provided about ongoing stressors. Discussed crisis plan, support from social network, calling 911, coming to the Emergency Department, and calling Suicide Hotline.  Hampton Abbot, MD 05/12/2016 1:40 PM

## 2016-05-12 NOTE — ED Provider Notes (Signed)
Psych team has re-evaluated pt: pt denies SI/HI/hallucinations and can be discharged back to the group home. Will d/c stable.    Francine Graven, DO 05/12/16 [phone]

## 2016-05-12 NOTE — Discharge Instructions (Signed)
Take your usual prescriptions as previously directed.  Call your regular mental health provider on Monday to schedule a follow up appointment within the next week.  Return to the Emergency Department immediately sooner if worsening.

## 2016-05-12 NOTE — ED Notes (Signed)
Pt noted to be sweating and shaking.  CBG done and wnl.  Pt states this happens occasionally to him.  Will continue to monitor.

## 2020-04-04 ENCOUNTER — Encounter: Payer: Self-pay | Admitting: *Deleted

## 2020-04-07 ENCOUNTER — Ambulatory Visit: Payer: Medicare Other | Admitting: Physician Assistant

## 2020-05-16 ENCOUNTER — Encounter: Payer: Self-pay | Admitting: Physician Assistant

## 2020-05-16 ENCOUNTER — Telehealth: Payer: Self-pay | Admitting: Physician Assistant

## 2020-05-16 ENCOUNTER — Ambulatory Visit (INDEPENDENT_AMBULATORY_CARE_PROVIDER_SITE_OTHER): Payer: Medicare Other | Admitting: Physician Assistant

## 2020-05-16 VITALS — BP 118/84 | HR 84 | Ht 71.0 in | Wt 224.0 lb

## 2020-05-16 DIAGNOSIS — K648 Other hemorrhoids: Secondary | ICD-10-CM | POA: Diagnosis not present

## 2020-05-16 DIAGNOSIS — I10 Essential (primary) hypertension: Secondary | ICD-10-CM

## 2020-05-16 DIAGNOSIS — E119 Type 2 diabetes mellitus without complications: Secondary | ICD-10-CM

## 2020-05-16 DIAGNOSIS — F259 Schizoaffective disorder, unspecified: Secondary | ICD-10-CM

## 2020-05-16 DIAGNOSIS — Z01818 Encounter for other preprocedural examination: Secondary | ICD-10-CM

## 2020-05-16 DIAGNOSIS — F79 Unspecified intellectual disabilities: Secondary | ICD-10-CM

## 2020-05-16 DIAGNOSIS — K625 Hemorrhage of anus and rectum: Secondary | ICD-10-CM | POA: Diagnosis not present

## 2020-05-16 DIAGNOSIS — K5900 Constipation, unspecified: Secondary | ICD-10-CM | POA: Diagnosis not present

## 2020-05-16 DIAGNOSIS — Z86010 Personal history of colonic polyps: Secondary | ICD-10-CM | POA: Diagnosis not present

## 2020-05-16 MED ORDER — PLENVU 140 G PO SOLR: 1.0000 | ORAL | 0 refills | Status: DC

## 2020-05-16 MED ORDER — HYDROCORTISONE ACETATE 25 MG RE SUPP: 25.0000 mg | Freq: Every day | RECTAL | 3 refills | Status: DC

## 2020-05-16 NOTE — Patient Instructions (Signed)
If you are age 38 or older, your body mass index should be between 23-30. Your Body mass index is 31.24 kg/m. If this is out of the aforementioned range listed, please consider follow up with your Primary Care Provider.  If you are age 62 or younger, your body mass index should be between 19-25. Your Body mass index is 31.24 kg/m. If this is out of the aformentioned range listed, please consider follow up with your Primary Care Provider.   You have been scheduled for a colonoscopy. Please follow written instructions given to you at your visit today.  Please pick up your prep supplies at the pharmacy within the next 1-3 days. If you use inhalers (even only as needed), please bring them with you on the day of your procedure.  Use Anusol suppositories rectally every night as needed for hemorrhoids.  Take 1/2 a capful of Miralax daily in 8 ounces of water or juice daily.  Follow up pending the results of your Colonoscopy or as needed.

## 2020-05-17 DIAGNOSIS — F79 Unspecified intellectual disabilities: Secondary | ICD-10-CM | POA: Insufficient documentation

## 2020-05-17 DIAGNOSIS — F259 Schizoaffective disorder, unspecified: Secondary | ICD-10-CM | POA: Insufficient documentation

## 2020-05-17 DIAGNOSIS — I10 Essential (primary) hypertension: Secondary | ICD-10-CM | POA: Insufficient documentation

## 2020-05-17 DIAGNOSIS — E119 Type 2 diabetes mellitus without complications: Secondary | ICD-10-CM | POA: Insufficient documentation

## 2020-05-17 NOTE — Progress Notes (Signed)
Subjective:    Patient ID: Jordan Gray, male    DOB: Dec 10, 1980, 39 y.o.   MRN: [id number]  HPI Jordan Gray is a 39 year old white male, new to GI today referred by Maudie Flakes NP for evaluation of recent complaints of rectal bleeding, and constipation.  Patient has history of schizoaffective disorder and intellectual disability and resides at a group home.  He does have a guardian. Patient and the caregiver who accompany him say that he has been having problems since early July with constipation which is apparently new for him.  He has been having a lot of straining for bowel movements.  Still able to have a bowel movement most days.  He had complained of seeing blood with his bowel movements on several occasions in July, has not noticed any blood over the past 3 weeks.  He has no complaints of abdominal pain.  He is on a stool softener daily and had been given MiraLAX but apparently if he takes this on an every day basis it causes diarrhea. Patient denies any rectal pain.  He feels that he has a hemorrhoid that will protrude out intermittently. He also relates that he has prior history of colon polyps.  He says he had a colonoscopy done in [place] 5 to 6 years ago and that he was told to have follow-up colonoscopy in 5 years.  He cannot tell me the name of the physician or practice. He does not know his family history.  No recent imaging  Review of Systems.Pertinent positive and negative review of systems were noted in the above HPI section.  All other review of systems was otherwise negative.  Outpatient Encounter Medications as of 05/16/2020  Medication Sig  . benztropine (COGENTIN) 1 MG tablet Take 1 mg by mouth 2 (two) times daily.  . cetirizine (ZYRTEC) 10 MG tablet Take 10 mg by mouth daily.  . clonazePAM (KLONOPIN) 1 MG tablet Take 1 mg by mouth in the morning, at noon, and at bedtime.  . divalproex (DEPAKOTE) 250 MG DR tablet Take 250 mg by mouth in the morning.  . divalproex  (DEPAKOTE) 500 MG DR tablet Take 500 mg by mouth at bedtime.  . docusate sodium (COLACE) 100 MG capsule Take 100 mg by mouth daily.  Marland Kitchen FLUoxetine (PROZAC) 40 MG capsule Take 40 mg by mouth daily.  . fluticasone (FLONASE) 50 MCG/ACT nasal spray Place 1 spray into both nostrils daily.  . haloperidol (HALDOL) 5 MG tablet Take 5 mg by mouth 2 (two) times daily.  . hydrocortisone (ANUSOL-HC) 2.5 % rectal cream Place 1 application rectally 2 (two) times daily.  . hydrocortisone (ANUSOL-HC) 25 MG suppository Place 1 suppository (25 mg total) rectally at bedtime.  Marland Kitchen lisinopril (ZESTRIL) 5 MG tablet Take 5 mg by mouth daily.  Marland Kitchen omeprazole (PRILOSEC) 20 MG capsule Take 20 mg by mouth daily.  . polyethylene glycol powder (MIRALAX) 17 GM/SCOOP powder Take 1 Container by mouth once.  . propranolol (INDERAL) 10 MG tablet Take 10 mg by mouth 3 (three) times daily.  . [DISCONTINUED] hydrocortisone (ANUSOL-HC) 25 MG suppository Place 25 mg rectally 2 (two) times daily as needed for hemorrhoids or anal itching.  Marland Kitchen PEG-KCl-NaCl-NaSulf-Na Asc-C (PLENVU) 140 g SOLR Take 1 kit by mouth as directed.   No facility-administered encounter medications on file as of 05/16/2020.   Allergies  Allergen Reactions  . Bactrim [Sulfamethoxazole-Trimethoprim]   . Latex   . Other     Nicotine patch  . Tomato  Patient Active Problem List   Diagnosis Date Noted  . Schizoaffective disorder ([place]) 05/17/2020  . Diabetes ([place]) 05/17/2020  . HTN (hypertension) 05/17/2020  . Intellectual disability 05/17/2020   Social History   Socioeconomic History  . Marital status: Single    Spouse name: Not on file  . Number of children: Not on file  . Years of education: Not on file  . Highest education level: Not on file  Occupational History  . Not on file  Tobacco Use  . Smoking status: Current Every Day Smoker  . Smokeless tobacco: Never Used  Substance and Sexual Activity  . Alcohol use: No  . Drug use: No  . Sexual  activity: Not on file  Other Topics Concern  . Not on file  Social History Narrative  . Not on file   Social Determinants of Health   Financial Resource Strain:   . Difficulty of Paying Living Expenses: Not on file  Food Insecurity:   . Worried About Charity fundraiser in the Last Year: Not on file  . Ran Out of Food in the Last Year: Not on file  Transportation Needs:   . Lack of Transportation (Medical): Not on file  . Lack of Transportation (Non-Medical): Not on file  Physical Activity:   . Days of Exercise per Week: Not on file  . Minutes of Exercise per Session: Not on file  Stress:   . Feeling of Stress : Not on file  Social Connections:   . Frequency of Communication with Friends and Family: Not on file  . Frequency of Social Gatherings with Friends and Family: Not on file  . Attends Religious Services: Not on file  . Active Member of Clubs or Organizations: Not on file  . Attends Archivist Meetings: Not on file  . Marital Status: Not on file  Intimate Partner Violence:   . Fear of Current or Ex-Partner: Not on file  . Emotionally Abused: Not on file  . Physically Abused: Not on file  . Sexually Abused: Not on file    Jordan Gray's family history includes Diabetes in his father; Throat cancer in his mother.      Objective:    Vitals:   05/16/20 1019  BP: 118/84  Pulse: 84    Physical Exam Well-developed well-nourished white male in no acute distress.  Height, Weight, 224 BMI 31.24 accompanied by caregiver from group home  HEENT; nontraumatic normocephalic, EOMI, PER R LA, sclera anicteric. Oropharynx; not examined Neck; supple, no JVD Cardiovascular; regular rate and rhythm with S1-S2, no murmur rub or gallop Pulmonary; Clear bilaterally Abdomen; soft, he has right lower quadrant and left lower quadrant tenderness, more notable on the left,, nondistended, no palpable mass or hepatosplenomegaly, bowel sounds are active Rectal; no external  hemorrhoids noted, stool brown, no obvious heme no palpable abnormality on digital exam.  He has a small lesion on the lower buttocks, mole versus wart Skin; benign exam, no jaundice rash or appreciable lesions Extremities; no clubbing cyanosis or edema skin warm and dry Neuro/Psych; alert and oriented x4, grossly nonfocal mood and affect appropriate       Assessment & Plan:   #23 39 year old white male with schizoaffective disorder and intellectual disability who resides at a group home locally.  Patient with new onset of constipation over the past couple of months, and complaints of bright red blood with bowel movements on several occasions about a month ago.  This has resolved over the past 3  weeks. Patient also has bilateral lower quadrant tenderness on exam today, etiology not clear. He relates history of colon polyps and indication for 5-year interval follow-up.  Patient uncertain of the name of the practice or physician who did last colonoscopy.  We will need to rule out functional constipation, with hemorrhoidal bleeding, rule out proctitis, IBD, neoplasm.  Plan; Patient will be scheduled for colonoscopy with Dr. Bryan Lemma.  Procedure was discussed in detail with the patient and the caregiver including indications risks and benefits and he is agreeable to proceed.  Patient's guardian will need to sign permit for procedure.  The group home is aware of this, and we will reach out to the guardian.  Start Anusol HC suppositories nightly for 7 to 10 days after any episode of bleeding. Continue Colace 1 at bedtime Start MiraLAX 17 g, one half dose daily in 8 ounces of water. Patient has not completed COVID-19 vaccination. Further recommendations pending findings at colonoscopy.  Aaralyn Kil S Kalanie Fewell PA-C 05/17/2020   Cc: Vonna Drafts, FNP

## 2020-05-18 MED ORDER — HYDROCORTISONE (PERIANAL) 2.5 % EX CREA: 1.0000 "application " | TOPICAL_CREAM | Freq: Two times a day (BID) | CUTANEOUS | 1 refills | Status: DC

## 2020-05-18 NOTE — Telephone Encounter (Signed)
Script sent to pharmacy.

## 2020-05-19 NOTE — Progress Notes (Signed)
Agree with the assessment and plan as outlined by Amy Esterwood, PA-C.  Barnard Sharps, DO, FACG  

## 2020-05-25 ENCOUNTER — Emergency Department (HOSPITAL_BASED_OUTPATIENT_CLINIC_OR_DEPARTMENT_OTHER)
Admission: EM | Admit: 2020-05-25 | Discharge: 2020-05-25 | Disposition: A | Discharge status: Home / Self Care | Payer: Medicare Other | Attending: Emergency Medicine | Admitting: Emergency Medicine

## 2020-05-25 ENCOUNTER — Encounter (HOSPITAL_BASED_OUTPATIENT_CLINIC_OR_DEPARTMENT_OTHER): Payer: Self-pay | Admitting: Emergency Medicine

## 2020-05-25 ENCOUNTER — Other Ambulatory Visit: Payer: Self-pay

## 2020-05-25 DIAGNOSIS — F17200 Nicotine dependence, unspecified, uncomplicated: Secondary | ICD-10-CM | POA: Insufficient documentation

## 2020-05-25 DIAGNOSIS — W01198A Fall on same level from slipping, tripping and stumbling with subsequent striking against other object, initial encounter: Secondary | ICD-10-CM | POA: Diagnosis not present

## 2020-05-25 DIAGNOSIS — Y939 Activity, unspecified: Secondary | ICD-10-CM | POA: Diagnosis not present

## 2020-05-25 DIAGNOSIS — S01112A Laceration without foreign body of left eyelid and periocular area, initial encounter: Secondary | ICD-10-CM | POA: Insufficient documentation

## 2020-05-25 DIAGNOSIS — Z79899 Other long term (current) drug therapy: Secondary | ICD-10-CM | POA: Diagnosis not present

## 2020-05-25 DIAGNOSIS — S0181XA Laceration without foreign body of other part of head, initial encounter: Secondary | ICD-10-CM

## 2020-05-25 DIAGNOSIS — I10 Essential (primary) hypertension: Secondary | ICD-10-CM | POA: Diagnosis not present

## 2020-05-25 DIAGNOSIS — E119 Type 2 diabetes mellitus without complications: Secondary | ICD-10-CM | POA: Insufficient documentation

## 2020-05-25 DIAGNOSIS — Y929 Unspecified place or not applicable: Secondary | ICD-10-CM | POA: Insufficient documentation

## 2020-05-25 DIAGNOSIS — Y999 Unspecified external cause status: Secondary | ICD-10-CM | POA: Diagnosis not present

## 2020-05-25 NOTE — ED Triage Notes (Addendum)
Pt from group home.  Pt fell last night and hit his head.  Unsure of LOC.  Home worker states he heard him fall and went straight in the room but patient was alert upon arrival.  Pt has laceration over left eye.  No bleeding noted.  No dizziness, no blurry vision, no N/V

## 2020-05-25 NOTE — ED Provider Notes (Signed)
[facility] EMERGENCY DEPARTMENT Provider Note   CSN: [id number] Arrival date & time: 05/25/20  1062     History Chief Complaint  Patient presents with  . Fall    Jordan Gray is a 39 y.o. male.  39 yo M with a chief complaints of a fall.  The patient was in his group home and had gotten up to go to the bathroom when he tripped over a fan that was in his room.  Struck the left side of his head.  Had nausea initially but no vomiting.  Was brought to the ED this morning for evaluation.  Patient denies any other injury denies neck pain chest pain abdominal pain extremity pain.  At his baseline per his group home.  The history is provided by the patient.  Fall This is a new problem. The current episode started yesterday. The problem occurs rarely. The problem has been resolved. Associated symptoms include headaches. Pertinent negatives include no chest pain, no abdominal pain and no shortness of breath. Nothing aggravates the symptoms. Nothing relieves the symptoms. He has tried nothing for the symptoms. The treatment provided no relief.       Past Medical History:  Diagnosis Date  . Diabetes mellitus without complication ([place])   . MR (mental retardation)   . Schizoaffective disorder [facility])     Patient Active Problem List   Diagnosis Date Noted  . Schizoaffective disorder ([place]) 05/17/2020  . Diabetes ([place]) 05/17/2020  . HTN (hypertension) 05/17/2020  . Intellectual disability 05/17/2020    Past Surgical History:  Procedure Laterality Date  . APPENDECTOMY    . HIP SURGERY Left        Family History  Problem Relation Age of Onset  . Throat cancer Mother   . Diabetes Father     Social History   Tobacco Use  . Smoking status: Current Every Day Smoker  . Smokeless tobacco: Never Used  Substance Use Topics  . Alcohol use: No  . Drug use: No    Home Medications Prior to Admission medications   Medication Sig Start Date End Date Taking? Authorizing  Provider  acetaminophen (TYLENOL) 325 MG tablet Take 650 mg by mouth every 6 (six) hours as needed for headache.   Yes [provider]  benztropine (COGENTIN) 1 MG tablet Take 1 mg by mouth 2 (two) times daily.   Yes [provider]  cetirizine (ZYRTEC) 10 MG tablet Take 10 mg by mouth daily.   Yes [provider]  clonazePAM (KLONOPIN) 1 MG tablet Take 1 mg by mouth in the morning, at noon, and at bedtime.   Yes [provider]  divalproex (DEPAKOTE) 250 MG DR tablet Take 250 mg by mouth in the morning.   Yes [provider]  divalproex (DEPAKOTE) 500 MG DR tablet Take 500 mg by mouth at bedtime.   Yes [provider]  docusate sodium (COLACE) 100 MG capsule Take 100 mg by mouth daily.   Yes [provider]  FLUoxetine (PROZAC) 40 MG capsule Take 40 mg by mouth daily.   Yes [provider]  fluticasone (FLONASE) 50 MCG/ACT nasal spray Place 1 spray into both nostrils daily.   Yes [provider]  haloperidol (HALDOL) 5 MG tablet Take 5 mg by mouth 2 (two) times daily.   Yes [provider]  hydrocortisone (ANUSOL-HC) 2.5 % rectal cream Place 1 application rectally 2 (two) times daily. 05/18/20  Yes Esterwood, Amy S, PA-C  hydrocortisone (ANUSOL-HC) 25  MG suppository Place 1 suppository (25 mg total) rectally at bedtime. 05/16/20  Yes Esterwood, Amy S, PA-C  lisinopril (ZESTRIL) 5 MG tablet Take 5 mg by mouth daily.   Yes [provider]  omeprazole (PRILOSEC) 20 MG capsule Take 20 mg by mouth daily.   Yes [provider]  polyethylene glycol powder (MIRALAX) 17 GM/SCOOP powder Take 1 Container by mouth once.   Yes [provider]  propranolol (INDERAL) 10 MG tablet Take 10 mg by mouth 3 (three) times daily.   Yes [provider]  PEG-KCl-NaCl-NaSulf-Na Asc-C (PLENVU) 140 g SOLR Take 1 kit by mouth as directed. 05/16/20   Esterwood, Amy S, PA-C    Allergies    Bactrim  [sulfamethoxazole-trimethoprim], Latex, Other, and Tomato  Review of Systems   Review of Systems  Constitutional: Negative for chills and fever.  HENT: Negative for congestion and facial swelling.   Eyes: Negative for discharge and visual disturbance.  Respiratory: Negative for shortness of breath.   Cardiovascular: Negative for chest pain and palpitations.  Gastrointestinal: Negative for abdominal pain, diarrhea and vomiting.  Musculoskeletal: Negative for arthralgias and myalgias.  Skin: Positive for wound. Negative for color change and rash.  Neurological: Positive for headaches. Negative for tremors and syncope.  Psychiatric/Behavioral: Negative for confusion and dysphoric mood.    Physical Exam Updated Vital Signs BP 110/74 (BP Location: Right Arm)   Pulse 74   Temp 98.8 F (37.1 C) (Oral)   Resp 16   SpO2 99%   Physical Exam Vitals and nursing note reviewed.  Constitutional:      Appearance: He is well-developed.  HENT:     Head: Normocephalic.     Comments: Well approximated 2.5 cm laceration above the left brow.  No active bleeding. Eyes:     Pupils: Pupils are equal, round, and reactive to light.  Neck:     Vascular: No JVD.  Cardiovascular:     Rate and Rhythm: Normal rate and regular rhythm.     Heart sounds: No murmur heard.  No friction rub. No gallop.   Pulmonary:     Effort: No respiratory distress.     Breath sounds: No wheezing.  Abdominal:     General: There is no distension.     Tenderness: There is no abdominal tenderness. There is no guarding or rebound.  Musculoskeletal:        General: Normal range of motion.     Cervical back: Normal range of motion and neck supple.  Skin:    Coloration: Skin is not pale.     Findings: No rash.  Neurological:     Mental Status: He is alert and oriented to person, place, and time.  Psychiatric:        Behavior: Behavior normal.     ED Results / Procedures / Treatments   Labs (all labs ordered are  listed, but only abnormal results are displayed) Labs Reviewed - No data to display  EKG None  Radiology No results found.  Procedures .Marland KitchenLaceration Repair  Date/Time: 05/25/2020 10:54 AM Performed by: Deno Etienne, DO Authorized by: Deno Etienne, DO   Consent:    Consent obtained:  Verbal   Consent given by:  Patient and guardian   Risks discussed:  Infection, pain, poor cosmetic result and poor wound healing   Alternatives discussed:  Delayed treatment, no treatment and observation Anesthesia (see MAR for exact dosages):    Anesthesia method:  None Laceration details:    Location:  Face  Face location:  L eyebrow   Length (cm):  2.7 Repair type:    Repair type:  Simple Pre-procedure details:    Preparation:  Patient was prepped and draped in usual sterile fashion Exploration:    Hemostasis achieved with:  Direct pressure   Wound exploration: entire depth of wound probed and visualized     Contaminated: no   Treatment:    Area cleansed with:  Saline   Amount of cleaning:  Standard   Irrigation solution:  Sterile saline   Irrigation volume:  30   Irrigation method:  Syringe   Visualized foreign bodies/material removed: no   Skin repair:    Repair method:  Tissue adhesive Approximation:    Approximation:  Close Post-procedure details:    Dressing:  Open (no dressing)   Patient tolerance of procedure:  Tolerated well, no immediate complications   (including critical care time)  Medications Ordered in ED Medications - No data to display  ED Course  I have reviewed the triage vital signs and the nursing notes.  Pertinent labs & imaging results that were available during my care of the patient were reviewed by me and considered in my medical decision making (see chart for details).    MDM Rules/Calculators/A&P                          39 yo M with a chief complaint of a fall.  This occurred last night.  The patient tripped over an object on the floor.  Patient had  his baseline.  No neurologic deficit.  Patient does have a history of mental retardation as well as schizophrenia though appears to be very functional and independent.  I feel that he is unlikely to have an intracranial bleed, he is [place] head CT rules negative.  Do not feel I need to image him based on his underlying MR.  10:55 AM:  I have discussed the diagnosis/risks/treatment options with the patient and believe the pt to be eligible for discharge home to follow-up with PCP. We also discussed returning to the ED immediately if new or worsening sx occur. We discussed the sx which are most concerning (e.g., sudden worsening pain, fever, inability to tolerate by mouth) that necessitate immediate return. Medications administered to the patient during their visit and any new prescriptions provided to the patient are listed below.  Medications given during this visit Medications - No data to display   The patient appears reasonably screen and/or stabilized for discharge and I doubt any other medical condition or other [facility] requiring further screening, evaluation, or treatment in the ED at this time prior to discharge.   Final Clinical Impression(s) / ED Diagnoses Final diagnoses:  Forehead laceration, initial encounter    Rx / DC Orders ED Discharge Orders    None       Deno Etienne, DO 05/25/20 1055

## 2020-05-25 NOTE — Discharge Instructions (Signed)
Return for confusion or vomiting.  Please return for redness drainage or if he develops a fever.  The glue should start to fall off about day five.  If it still there at that time he can gently remove it.  You could also use Vaseline bacitracin or Neosporin to help you dissolve it.  Do not fully immersed this under water until it is gone.

## 2020-05-25 NOTE — ED Notes (Signed)
Dermabond at bedside for EDP to apply to patient's laceration to left forehead.

## 2020-05-27 ENCOUNTER — Other Ambulatory Visit: Payer: Self-pay | Admitting: Gastroenterology

## 2020-05-27 ENCOUNTER — Other Ambulatory Visit: Payer: Self-pay

## 2020-05-27 ENCOUNTER — Ambulatory Visit (INDEPENDENT_AMBULATORY_CARE_PROVIDER_SITE_OTHER): Payer: Medicare Other

## 2020-05-27 DIAGNOSIS — Z1159 Encounter for screening for other viral diseases: Secondary | ICD-10-CM

## 2020-05-27 LAB — SARS CORONAVIRUS 2 (TAT 6-24 HRS): SARS Coronavirus 2: NEGATIVE

## 2020-05-31 ENCOUNTER — Telehealth: Payer: Self-pay | Admitting: Physician Assistant

## 2020-05-31 NOTE — Telephone Encounter (Signed)
Jordan Gray,  This pt did not have a PV- just OV  Thanks

## 2020-05-31 NOTE — Telephone Encounter (Signed)
Spoke with a pharmacy representative, she advised that the insurance only would pay for Go-lightly, but it would have to be ordered. I advised that I could have a sample waiting for him of Plenvu, if they could have someone pick it up. She is contacting the patient's facility to make sure that they can have someone to pick it up.

## 2020-05-31 NOTE — Telephone Encounter (Signed)
Pharmacy called to advise that the prep medication needs prior auth from insurance please advise they leave by 10 am for deliveries and procedure os for tomorrow please advise

## 2020-06-01 ENCOUNTER — Ambulatory Visit: Payer: Medicare Other | Admitting: Gastroenterology

## 2020-06-01 ENCOUNTER — Encounter: Payer: Self-pay | Admitting: Gastroenterology

## 2020-06-01 ENCOUNTER — Other Ambulatory Visit: Payer: Self-pay

## 2020-06-01 ENCOUNTER — Ambulatory Visit (AMBULATORY_SURGERY_CENTER): Payer: Medicare Other | Admitting: Gastroenterology

## 2020-06-01 VITALS — BP 112/67 | HR 69 | Resp 12

## 2020-06-01 VITALS — BP 135/84 | HR 93 | Temp 96.8°F | Ht 71.0 in | Wt 224.0 lb

## 2020-06-01 DIAGNOSIS — K649 Unspecified hemorrhoids: Secondary | ICD-10-CM

## 2020-06-01 DIAGNOSIS — K635 Polyp of colon: Secondary | ICD-10-CM

## 2020-06-01 DIAGNOSIS — D122 Benign neoplasm of ascending colon: Secondary | ICD-10-CM

## 2020-06-01 DIAGNOSIS — K640 First degree hemorrhoids: Secondary | ICD-10-CM

## 2020-06-01 DIAGNOSIS — D128 Benign neoplasm of rectum: Secondary | ICD-10-CM

## 2020-06-01 DIAGNOSIS — K5900 Constipation, unspecified: Secondary | ICD-10-CM | POA: Diagnosis not present

## 2020-06-01 DIAGNOSIS — K621 Rectal polyp: Secondary | ICD-10-CM

## 2020-06-01 DIAGNOSIS — K921 Melena: Secondary | ICD-10-CM | POA: Diagnosis not present

## 2020-06-01 DIAGNOSIS — D125 Benign neoplasm of sigmoid colon: Secondary | ICD-10-CM

## 2020-06-01 MED ORDER — SODIUM CHLORIDE 0.9 % IV SOLN: 500.0000 mL | Freq: Once | INTRAVENOUS | Status: DC

## 2020-06-01 NOTE — Progress Notes (Signed)
Called to room to assist during endoscopic procedure.  Patient ID and intended procedure confirmed with present staff. Received instructions for my participation in the procedure from the performing physician.  

## 2020-06-01 NOTE — Patient Instructions (Signed)
Please read handouts provided. Use a fiber supplement. Continue present medications. Apply Desitin cream or other barrier cream to perianal area. Await pathology results.      YOU HAD AN ENDOSCOPIC PROCEDURE TODAY AT [facility]:   Refer to the procedure report that was given to you for any specific questions about what was found during the examination.  If the procedure report does not answer your questions, please call your gastroenterologist to clarify.  If you requested that your care partner not be given the details of your procedure findings, then the procedure report has been included in a sealed envelope for you to review at your convenience later.  YOU SHOULD EXPECT: Some feelings of bloating in the abdomen. Passage of more gas than usual.  Walking can help get rid of the air that was put into your GI tract during the procedure and reduce the bloating. If you had a lower endoscopy (such as a colonoscopy or flexible sigmoidoscopy) you may notice spotting of blood in your stool or on the toilet paper. If you underwent a bowel prep for your procedure, you may not have a normal bowel movement for a few days.  Please Note:  You might notice some irritation and congestion in your nose or some drainage.  This is from the oxygen used during your procedure.  There is no need for concern and it should clear up in a day or so.  SYMPTOMS TO REPORT IMMEDIATELY:   Following lower endoscopy (colonoscopy or flexible sigmoidoscopy):  Excessive amounts of blood in the stool  Significant tenderness or worsening of abdominal pains  Swelling of the abdomen that is new, acute  Fever of 100F or higher     For urgent or emergent issues, a gastroenterologist can be reached at any hour by calling [phone]. Do not use MyChart messaging for urgent concerns.    DIET:  We do recommend a small meal at first, but then you may proceed to your regular diet.  Drink plenty of fluids but  you should avoid alcoholic beverages for 24 hours.  ACTIVITY:  You should plan to take it easy for the rest of today and you should NOT DRIVE or use heavy machinery until tomorrow (because of the sedation medicines used during the test).    FOLLOW UP: Our staff will call the number listed on your records 48-72 hours following your procedure to check on you and address any questions or concerns that you may have regarding the information given to you following your procedure. If we do not reach you, we will leave a message.  We will attempt to reach you two times.  During this call, we will ask if you have developed any symptoms of COVID 19. If you develop any symptoms (ie: fever, flu-like symptoms, shortness of breath, cough etc.) before then, please call [phone].  If you test positive for Covid 19 in the 2 weeks post procedure, please call and report this information to [place].    If any biopsies were taken you will be contacted by phone or by letter within the next 1-3 weeks.  Please call us at [phone] if you have not heard about the biopsies in 3 weeks.    SIGNATURES/CONFIDENTIALITY: You and/or your care partner have signed paperwork which will be entered into your electronic medical record.  These signatures attest to the fact that that the information above on your After Visit Summary has been reviewed and is understood.  Full responsibility  of the confidentiality of this discharge information lies with you and/or your care-partner.

## 2020-06-01 NOTE — Progress Notes (Signed)
Pt's states no medical or surgical changes since previsit or office visit.  HC - vitals

## 2020-06-01 NOTE — Progress Notes (Signed)
pt tolerated well. VSS. awake and to recovery. Report given to RN.  

## 2020-06-01 NOTE — Op Note (Signed)
Jordan Gray Patient Name: Jordan Gray Procedure Date: 06/01/2020 9:44 AM MRN: [id number] Endoscopist: Gerrit Heck , MD Age: 39 Referring MD:  Date of Birth: 15-Sep-1981 Gender: Male Account #: [id number] Procedure:                Colonoscopy Indications:              Surveillance: Personal history of colonic polyps                            (unknown histology) on last colonoscopy at outside                            facility approximately 6 years ago, with                            recommendation to repeat in 5 years for ongoing                            polyp surveillance.                           Additionally, recent onset bilateral lower                            abdominal pain, Hematochezia, and Constipation Medicines:                Monitored Anesthesia Care Procedure:                Pre-Anesthesia Assessment:                           - Prior to the procedure, a History and Physical                            was performed, and patient medications and                            allergies were reviewed. The patient's tolerance of                            previous anesthesia was also reviewed. The risks                            and benefits of the procedure and the sedation                            options and risks were discussed with the patient.                            All questions were answered, and informed consent                            was obtained. Prior Anticoagulants: The patient has  taken no previous anticoagulant or antiplatelet                            agents. ASA Grade Assessment: II - A patient with                            mild systemic disease. After reviewing the risks                            and benefits, the patient was deemed in                            satisfactory condition to undergo the procedure.                           After obtaining informed consent, the colonoscope                             was passed under direct vision. Throughout the                            procedure, the patient's blood pressure, pulse, and                            oxygen saturations were monitored continuously. The                            Colonoscope was introduced through the anus and                            advanced to the the cecum, identified by                            appendiceal orifice and ileocecal valve. The                            colonoscopy was performed without difficulty. The                            patient tolerated the procedure well. The quality                            of the bowel preparation was good. Scope In: 9:59:44 AM Scope Out: 10:23:41 AM Scope Withdrawal Time: 0 hours 18 minutes 27 seconds  Total Procedure Duration: 0 hours 23 minutes 57 seconds  Findings:                 The perianal exam findings include a perianal rash.                           A 2 mm polyp was found in the ascending colon. The                            polyp was sessile. The polyp  was removed with a                            cold biopsy forceps. Resection and retrieval were                            complete. Estimated blood loss was minimal.                           Five sessile polyps were found in the sigmoid                            colon. The polyps were 3 to 5 mm in size. These                            polyps were removed with a cold snare. Resection                            and retrieval were complete. Estimated blood loss                            was minimal.                           A 2 mm polyp was found in the rectum. The polyp was                            sessile. The polyp was removed with a cold biopsy                            forceps. Resection and retrieval were complete.                            Estimated blood loss was minimal.                           Non-bleeding internal hemorrhoids were found during                             retroflexion. The hemorrhoids were small.                           The exam was otherwise normal throughout the                            remainder of the colon. Complications:            No immediate complications. Estimated Blood Loss:     Estimated blood loss was minimal. Impression:               - Perianal rash found on perianal exam.                           - One 2 mm polyp in the ascending colon, removed  with a cold biopsy forceps. Resected and retrieved.                           - Five 3 to 5 mm polyps in the sigmoid colon,                            removed with a cold snare. Resected and retrieved.                           - One 2 mm polyp in the rectum, removed with a cold                            biopsy forceps. Resected and retrieved.                           - Non-bleeding internal hemorrhoids. Recommendation:           - Patient has a contact number available for                            emergencies. The signs and symptoms of potential                            delayed complications were discussed with the                            patient. Return to normal activities tomorrow.                            Written discharge instructions were provided to the                            patient.                           - Resume previous diet.                           - Continue present medications.                           - Await pathology results.                           - Desitin cream, or other barrier cream, applied to                            perianal area.                           - Repeat colonoscopy in 3 - 5 years for                            surveillance based on pathology results.                           -  Use fiber, for example Citrucel, Fibercon, Konsyl                            or Metamucil.                           - Internal hemorrhoids were noted on this study and                            may be amenable  to hemorrhoid band ligation. If you                            are interested in further treatment of these                            hemorrhoids with band ligation, please contact my                            clinic to set up an appointment for evaluation and                            treatment. Gerrit Heck, MD 06/01/2020 10:31:06 AM

## 2020-06-02 NOTE — Telephone Encounter (Signed)
Someone was able to come pick up sample

## 2020-06-03 ENCOUNTER — Telehealth: Payer: Self-pay

## 2020-06-03 NOTE — Telephone Encounter (Signed)
  Follow up Call-  Call back number 06/01/2020  Post procedure Call Back phone  # [phone]  Permission to leave phone message Yes  Some recent data might be hidden     Patient questions:  Do you have a fever, pain , or abdominal swelling? No. Pain Score  0 *  Have you tolerated food without any problems? Yes.    Have you been able to return to your normal activities? Yes.    Do you have any questions about your discharge instructions: Diet   No. Medications  No. Follow up visit  No.  Do you have questions or concerns about your Care? No.  Actions: * If pain score is 4 or above: No action needed, pain <4.  1. Have you developed a fever since your procedure? no  2.   Have you had an respiratory symptoms (SOB or cough) since your procedure? no  3.   Have you tested positive for COVID 19 since your procedure no  4.   Have you had any family members/close contacts diagnosed with the COVID 19 since your procedure?  no   If yes to any of these questions please route to Joylene John, RN and Joella Prince, RN

## 2020-06-06 ENCOUNTER — Encounter: Payer: Self-pay | Admitting: Gastroenterology

## 2020-07-29 ENCOUNTER — Encounter (HOSPITAL_COMMUNITY): Payer: Self-pay | Admitting: Emergency Medicine

## 2020-07-29 ENCOUNTER — Emergency Department (HOSPITAL_COMMUNITY)
Admission: EM | Admit: 2020-07-29 | Discharge: 2020-08-06 | Disposition: A | Discharge status: Home / Self Care | Payer: Medicare Other | Attending: Emergency Medicine | Admitting: Emergency Medicine

## 2020-07-29 ENCOUNTER — Other Ambulatory Visit: Payer: Self-pay

## 2020-07-29 DIAGNOSIS — F259 Schizoaffective disorder, unspecified: Secondary | ICD-10-CM | POA: Diagnosis present

## 2020-07-29 DIAGNOSIS — Z91040 Latex allergy status: Secondary | ICD-10-CM | POA: Diagnosis not present

## 2020-07-29 DIAGNOSIS — W25XXXA Contact with sharp glass, initial encounter: Secondary | ICD-10-CM | POA: Diagnosis not present

## 2020-07-29 DIAGNOSIS — E119 Type 2 diabetes mellitus without complications: Secondary | ICD-10-CM | POA: Insufficient documentation

## 2020-07-29 DIAGNOSIS — F250 Schizoaffective disorder, bipolar type: Secondary | ICD-10-CM | POA: Diagnosis not present

## 2020-07-29 DIAGNOSIS — S51812A Laceration without foreign body of left forearm, initial encounter: Secondary | ICD-10-CM | POA: Diagnosis not present

## 2020-07-29 DIAGNOSIS — Z79899 Other long term (current) drug therapy: Secondary | ICD-10-CM | POA: Insufficient documentation

## 2020-07-29 DIAGNOSIS — I10 Essential (primary) hypertension: Secondary | ICD-10-CM | POA: Diagnosis not present

## 2020-07-29 DIAGNOSIS — Z20822 Contact with and (suspected) exposure to covid-19: Secondary | ICD-10-CM | POA: Insufficient documentation

## 2020-07-29 DIAGNOSIS — F79 Unspecified intellectual disabilities: Secondary | ICD-10-CM | POA: Insufficient documentation

## 2020-07-29 DIAGNOSIS — F17210 Nicotine dependence, cigarettes, uncomplicated: Secondary | ICD-10-CM | POA: Insufficient documentation

## 2020-07-29 DIAGNOSIS — R45851 Suicidal ideations: Secondary | ICD-10-CM | POA: Insufficient documentation

## 2020-07-29 DIAGNOSIS — S59912A Unspecified injury of left forearm, initial encounter: Secondary | ICD-10-CM | POA: Diagnosis present

## 2020-07-29 LAB — COMPREHENSIVE METABOLIC PANEL
ALT: 14 U/L (ref 0–44)
AST: 16 U/L (ref 15–41)
Albumin: 3.7 g/dL (ref 3.5–5.0)
Alkaline Phosphatase: 46 U/L (ref 38–126)
Anion gap: 9 (ref 5–15)
BUN: 13 mg/dL (ref 6–20)
CO2: 25 mmol/L (ref 22–32)
Calcium: 9.6 mg/dL (ref 8.9–10.3)
Chloride: 103 mmol/L (ref 98–111)
Creatinine, Ser: 1.05 mg/dL (ref 0.61–1.24)
GFR, Estimated: >60 mL/min (ref >60)
Glucose, Bld: 105 mg/dL — ABNORMAL HIGH (ref 70–99)
Potassium: 3.7 mmol/L (ref 3.5–5.1)
Sodium: 137 mmol/L (ref 135–145)
Total Bilirubin: 1 mg/dL (ref 0.3–1.2)
Total Protein: 6.7 g/dL (ref 6.5–8.1)

## 2020-07-29 LAB — CBC
HCT: 47.7 % (ref 39.0–52.0)
Hemoglobin: 16.5 g/dL (ref 13.0–17.0)
MCH: 30.8 pg (ref 26.0–34.0)
MCHC: 34.6 g/dL (ref 30.0–36.0)
MCV: 89 fL (ref 80.0–100.0)
Platelets: 270 10*3/uL (ref 150–400)
RBC: 5.36 MIL/uL (ref 4.22–5.81)
RDW: 13.2 % (ref 11.5–15.5)
WBC: 9 10*3/uL (ref 4.0–10.5)
nRBC: 0 % (ref 0.0–0.2)

## 2020-07-29 LAB — ETHANOL: Alcohol, Ethyl (B): <10 mg/dL (ref <10)

## 2020-07-29 LAB — SALICYLATE LEVEL: Salicylate Lvl: <7 mg/dL — ABNORMAL LOW (ref 7.0–30.0)

## 2020-07-29 LAB — ACETAMINOPHEN LEVEL: Acetaminophen (Tylenol), Serum: <10 ug/mL — ABNORMAL LOW (ref 10–30)

## 2020-07-29 NOTE — ED Triage Notes (Addendum)
Pt to ED via [facility] after reported cutting himself on left arm with a broken piece of glass he got out of garbage can.  Pt st's he wants to kill himself  Dressing not removed in triage

## 2020-07-30 ENCOUNTER — Other Ambulatory Visit: Payer: Self-pay

## 2020-07-30 DIAGNOSIS — S51812A Laceration without foreign body of left forearm, initial encounter: Secondary | ICD-10-CM | POA: Diagnosis not present

## 2020-07-30 LAB — RAPID URINE DRUG SCREEN, HOSP PERFORMED
Amphetamines: NOT DETECTED
Barbiturates: NOT DETECTED
Benzodiazepines: NOT DETECTED
Cocaine: NOT DETECTED
Opiates: NOT DETECTED
Tetrahydrocannabinol: NOT DETECTED

## 2020-07-30 LAB — RESPIRATORY PANEL BY RT PCR (FLU A&B, COVID)
Influenza A by PCR: NEGATIVE
Influenza B by PCR: NEGATIVE
SARS Coronavirus 2 by RT PCR: NEGATIVE

## 2020-07-30 MED ORDER — IBUPROFEN 400 MG PO TABS
600.0000 mg | ORAL_TABLET | Freq: Three times a day (TID) | ORAL | Status: DC | PRN
  Administered 2020-07-30 – 2020-08-04 (×12): 600 mg via ORAL
  Filled 2020-07-30 (×12): qty 1

## 2020-07-30 MED ORDER — CLONAZEPAM 0.5 MG PO TABS
1.0000 mg | ORAL_TABLET | Freq: Three times a day (TID) | ORAL | Status: DC
  Administered 2020-07-30 – 2020-08-06 (×22): 1 mg via ORAL
  Filled 2020-07-30 (×22): qty 2

## 2020-07-30 MED ORDER — DIVALPROEX SODIUM 250 MG PO DR TAB
500.0000 mg | DELAYED_RELEASE_TABLET | Freq: Every day | ORAL | Status: DC
  Administered 2020-07-30 – 2020-08-05 (×7): 500 mg via ORAL
  Filled 2020-07-30 (×7): qty 2

## 2020-07-30 MED ORDER — PANTOPRAZOLE SODIUM 40 MG PO TBEC
40.0000 mg | DELAYED_RELEASE_TABLET | Freq: Every day | ORAL | Status: DC
  Administered 2020-07-30 – 2020-08-06 (×8): 40 mg via ORAL
  Filled 2020-07-30 (×9): qty 1

## 2020-07-30 MED ORDER — BENZTROPINE MESYLATE 1 MG PO TABS
1.0000 mg | ORAL_TABLET | Freq: Two times a day (BID) | ORAL | Status: DC
  Administered 2020-07-30 – 2020-08-06 (×16): 1 mg via ORAL
  Filled 2020-07-30 (×16): qty 1

## 2020-07-30 MED ORDER — DIVALPROEX SODIUM 250 MG PO DR TAB
250.0000 mg | DELAYED_RELEASE_TABLET | Freq: Every morning | ORAL | Status: DC
  Administered 2020-07-30 – 2020-08-01 (×3): 250 mg via ORAL
  Filled 2020-07-30 (×3): qty 1

## 2020-07-30 MED ORDER — LIDOCAINE-EPINEPHRINE 1 %-1:100000 IJ SOLN
10.0000 mL | Freq: Once | INTRAMUSCULAR | Status: AC
  Administered 2020-07-30: 10 mL
  Filled 2020-07-30: qty 1

## 2020-07-30 MED ORDER — HALOPERIDOL 5 MG PO TABS
5.0000 mg | ORAL_TABLET | Freq: Two times a day (BID) | ORAL | Status: DC
  Administered 2020-07-30 – 2020-08-01 (×6): 5 mg via ORAL
  Filled 2020-07-30 (×6): qty 1

## 2020-07-30 MED ORDER — PROPRANOLOL HCL 10 MG PO TABS
10.0000 mg | ORAL_TABLET | Freq: Three times a day (TID) | ORAL | Status: DC
  Administered 2020-07-30 – 2020-08-06 (×19): 10 mg via ORAL
  Filled 2020-07-30 (×26): qty 1

## 2020-07-30 MED ORDER — LISINOPRIL 2.5 MG PO TABS
5.0000 mg | ORAL_TABLET | Freq: Every day | ORAL | Status: DC
  Administered 2020-07-30 – 2020-08-06 (×7): 5 mg via ORAL
  Filled 2020-07-30: qty 2
  Filled 2020-07-30: qty 1
  Filled 2020-07-30 (×5): qty 2
  Filled 2020-07-30: qty 1

## 2020-07-30 MED ORDER — MELATONIN 3 MG PO TABS
3.0000 mg | ORAL_TABLET | Freq: Every day | ORAL | Status: DC
  Administered 2020-07-30 – 2020-08-05 (×7): 3 mg via ORAL
  Filled 2020-07-30 (×8): qty 1

## 2020-07-30 MED ORDER — NICOTINE POLACRILEX 2 MG MT GUM
2.0000 mg | CHEWING_GUM | OROMUCOSAL | Status: DC | PRN
  Administered 2020-07-30 – 2020-08-05 (×14): 2 mg via ORAL
  Filled 2020-07-30 (×18): qty 1

## 2020-07-30 MED ORDER — FLUOXETINE HCL 20 MG PO CAPS
40.0000 mg | ORAL_CAPSULE | Freq: Every day | ORAL | Status: DC
  Administered 2020-07-30 – 2020-08-01 (×3): 40 mg via ORAL
  Filled 2020-07-30 (×3): qty 2

## 2020-07-30 NOTE — ED Notes (Signed)
Assumed patient care.

## 2020-07-30 NOTE — ED Notes (Signed)
Patient wants his 4pm meds when he gets his dinner

## 2020-07-30 NOTE — BH Assessment (Signed)
TTS reassessment:  Requested TTS machine--RN stated to call in 15 minutes.  Attempted x 3 starting at 10:50am.   Jeanmarie Plant, MSW, LCSW Outpatient Therapist/Triage Specialist

## 2020-07-30 NOTE — ED Notes (Signed)
Assuming care of patient at this time. Pt in hallway bed with sitter in place. Pt in NAD. Resp even and unlabored. Bed in low position. Environment secured.

## 2020-07-30 NOTE — ED Notes (Signed)
Pt back to Hallway 19 with sitter at bedside.

## 2020-07-30 NOTE — BH Assessment (Signed)
Tele Assessment Note   Patient Name: [facility] MRN: [id number] Referring Physician: Joseph Berkshire, MD Location of Patient: [facility] ED, [phone] Location of Provider: [facility] is an 39 y.o. single male who presents unaccompanied to [facility] via EMS after cutting his left arm several times with broken glass requiring sutures. Pt states this was a suicide attempt. Pt has a diagnosis of schizoaffective disorder and intellectual disability. He says he has not slept in three days and is experiencing racing thoughts. He states he is seeing shadows and demons and that his mind is telling him to cut his throat and wrists and kill himself. He says he lives an assisted living facility and that he found broken glass in the garbage that he was able to use to cut himself. He says staff called 911. He states he has attempted suicide in the past. Pt acknowledges symptoms including crying spells, social withdrawal, loss of interest in usual pleasures, fatigue, irritability, decreased concentration, and feelings of worthlessness and hopelessness. He denies current homicidal ideation but states he has a history of being physically aggressive in the past. He denies use of alcohol or other substances.  Pt confirms his legal guardian's name is [facility] [phone]. Pt says "[facility] Gray" works at his ALF but Pt cannot remember the name of the facility, the address or any telephone numbers. TTS attempted to contact Pt's legal guardian and left voicemail message asking [facility] to contact TTS. Pt denies legal problems. Pt cannot remember what medications he is prescribed. He says he was inpatient at [facility] for one year in 2020. Pt's medical record indicates Pt has been psychiatrically hospitalized several times in the past.  Pt is dressed in hospital scrubs, alert and oriented x4. Pt speaks in a clear tone, at moderate volume and normal pace. Motor behavior appears  normal. Eye contact is good. Pt's mood is anxious and affect is congruent with mood. Thought process is coherent and relevant. There is no indication Pt is currently responding to internal stimuli or experiencing delusional thought content. Insight and judgment are limited. Pt was cooperative throughout assessment. He is agreeable to inpatient psychiatric treatment.   Diagnosis: F25.0 Schizoaffective disorder, Bipolar type Intellectual disability  Past Medical History:  Past Medical History:  Diagnosis Date  . Diabetes mellitus without complication ([facility])   . Jordan (mental retardation)   . Schizoaffective disorder [facility])     Past Surgical History:  Procedure Laterality Date  . APPENDECTOMY    . HIP SURGERY Left     Family History:  Family History  Problem Relation Age of Onset  . Throat cancer Mother   . Esophageal cancer Mother   . Diabetes Father   . Colon cancer Neg Hx   . Rectal cancer Neg Hx   . Stomach cancer Neg Hx     Social History:  reports that he has been smoking cigarettes. He has been smoking about 0.25 packs per day. He has never used smokeless tobacco. He reports that he does not drink alcohol and does not use drugs.  Additional Social History:  Alcohol / Drug Use Pain Medications: Denies abuse Prescriptions: Denies abuse Over the Counter: Denies abuse History of alcohol / drug use?: No history of alcohol / drug abuse Longest period of sobriety (when/how long): NA  CIWA: CIWA-Ar BP: 118/76 Pulse Rate: 74 COWS:    Allergies:  Allergies  Allergen Reactions  . Bactrim [Sulfamethoxazole-Trimethoprim]   . Latex   .  Other     Nicotine patch  . Tomato     Home Medications: (Not in a hospital admission)   OB/GYN Status:  No LMP for male patient.  General Assessment Data Location of Assessment: [facility] TTS Assessment: In system Is this a Tele or Face-to-Face Assessment?: Tele Assessment Is this an Initial Assessment or a Re-assessment for this  encounter?: Initial Assessment Patient Accompanied by:: N/A Language Other than English: No Living Arrangements: In Assisted Living/Nursing Home (Comment: Name of Nursing Home What gender do you identify as?: Male Date Telepsych consult ordered in [facility]: 07/30/20 Time Telepsych consult ordered in [facility]: 0308 Marital status: Single Maiden name: NA Pregnancy Status: No Living Arrangements: Group Home Can pt return to current living arrangement?: Yes Admission Status: Voluntary Is patient capable of signing voluntary admission?: No Referral Source: Other (ALF staff) Insurance type: Medicare     Crisis Care Plan Living Arrangements: Group Home Legal Guardian: Other: [facility] [phone]) Name of Psychiatrist: Unknown Name of Therapist: Unknown  Education Status Is patient currently in school?: No Is the patient employed, unemployed or receiving disability?: Receiving disability income  Risk to self with the past 6 months Suicidal Ideation: Yes-Currently Present Has patient been a risk to self within the past 6 months prior to admission? : Yes Suicidal Intent: Yes-Currently Present Has patient had any suicidal intent within the past 6 months prior to admission? : Yes Is patient at risk for suicide?: Yes Suicidal Plan?: Yes-Currently Present Has patient had any suicidal plan within the past 6 months prior to admission? : Yes Specify Current Suicidal Plan: Cut his wrist and throat Access to Means: Yes Specify Access to Suicidal Means: Pt access broken glass What has been your use of drugs/alcohol within the last 12 months?: Pt denies Previous Attempts/Gestures: Yes How many times?: 3 Other Self Harm Risks: None Triggers for Past Attempts: Hallucinations Intentional Self Injurious Behavior: Cutting Comment - Self Injurious Behavior: Pt cut left forearm requiring sutures Family Suicide History: Unknown Recent stressful life event(s): Loss (Comment) (Uncle died) Persecutory  voices/beliefs?: Yes Depression: Yes Depression Symptoms: Despondent, Tearfulness, Isolating, Fatigue, Guilt, Loss of interest in usual pleasures, Feeling worthless/self pity, Feeling angry/irritable Substance abuse history and/or treatment for substance abuse?: No Suicide prevention information given to non-admitted patients: Not applicable  Risk to Others within the past 6 months Homicidal Ideation: No Does patient have any lifetime risk of violence toward others beyond the six months prior to admission? : Yes (comment) (Pt reports he has been aggressive in the past) Thoughts of Harm to Others: No Current Homicidal Intent: No Current Homicidal Plan: No Access to Homicidal Means: No Identified Victim: None History of harm to others?: Yes Assessment of Violence: In distant past Violent Behavior Description: Pt reports he has been physically aggressive in the past Does patient have access to weapons?: No Criminal Charges Pending?: No Does patient have a court date: No Is patient on probation?: No  Psychosis Hallucinations: Auditory, Visual, With command Delusions: None noted  Mental Status Report Appearance/Hygiene: In scrubs Eye Contact: Good Motor Activity: Unremarkable Speech: Logical/coherent Level of Consciousness: Alert Mood: Anxious Affect: Anxious Anxiety Level: Minimal Thought Processes: Coherent Judgement: Impaired Orientation: Person, Place, Time, Situation Obsessive Compulsive Thoughts/Behaviors: None  Cognitive Functioning Concentration: Decreased Memory: Recent Intact, Remote Intact Is patient IDD: Yes Level of Function: Unknown Is IQ score available?: No Insight: Poor Impulse Control: Poor Appetite: Good Have you had any weight changes? : No Change Sleep: Decreased Total Hours of Sleep:  0 Vegetative Symptoms: None  ADLScreening [facility]) Patient's cognitive ability adequate to safely complete daily activities?: Yes Patient able to  express need for assistance with ADLs?: Yes Independently performs ADLs?: Yes (appropriate for developmental age)  Prior Inpatient Therapy Prior Inpatient Therapy: Yes Prior Therapy Dates: 2020, multiple admits Prior Therapy Facilty/Provider(s): [facility], other facilities Reason for Treatment: Schizoaffective disorder  Prior Outpatient Therapy Prior Outpatient Therapy: Yes Prior Therapy Dates: Current Prior Therapy Facilty/Provider(s): unknown Reason for Treatment: Schizoaffective disorder Does patient have an ACCT team?: No Does patient have Intensive In-House Services?  : No Does patient have [facility] services? : No Does patient have P4CC services?: No  ADL Screening (condition at time of admission) Patient's cognitive ability adequate to safely complete daily activities?: Yes Is the patient deaf or have difficulty hearing?: No Does the patient have difficulty seeing, even when wearing glasses/contacts?: No Does the patient have difficulty concentrating, remembering, or making decisions?: Yes Patient able to express need for assistance with ADLs?: Yes Does the patient have difficulty dressing or bathing?: No Independently performs ADLs?: Yes (appropriate for developmental age) Does the patient have difficulty walking or climbing stairs?: No Weakness of Legs: None Weakness of Arms/Hands: None  Home Assistive Devices/Equipment Home Assistive Devices/Equipment: None    Abuse/Neglect Assessment (Assessment to be complete while patient is alone) Abuse/Neglect Assessment Can Be Completed: Yes Physical Abuse: Denies Verbal Abuse: Denies Sexual Abuse: Denies Exploitation of patient/patient's resources: Denies Self-Neglect: Denies     Regulatory affairs officer (For Healthcare) Does Patient Have a Medical Advance Directive?: No (Pt has legal guardian)          Disposition: Gave clinical report to Caroline Sauger, NP who said Pt meets criteria for inpatient psychiatric  treatment. Appropriate facilities will be contacted for placement. Notified Dr. [facility] and Irene Shipper, RN of recommendation.  Disposition Initial Assessment Completed for this Encounter: Yes  This service was provided via telemedicine using a 2-way, interactive audio and video technology.  Names of all persons participating in this telemedicine service and their role in this encounter. Name: Gilford Rile Role: Patient  Name: Storm Frisk, [facility] Role: TTS counselor         Orpah Greek Anson Fret, [facility], [facility] Triage Specialist [phone]  Evelena Peat 07/30/2020 5:05 AM

## 2020-07-30 NOTE — ED Notes (Signed)
Pt to Room 46 with sitter for TTS session.

## 2020-07-30 NOTE — ED Notes (Signed)
Pt temporally placed in room 54 to TTS eval. Sitter remains at bedside.

## 2020-07-30 NOTE — ED Notes (Signed)
Patient took shower and changed scrubs

## 2020-07-30 NOTE — BH Assessment (Signed)
11/6- Per Oneida Alar NP: pt remain overnight observation for monitoring and stabilization and provider reassessment tomorrow (11/7)  Jeanmarie Plant, MSW, LCSW Outpatient Therapist/Triage Specialist

## 2020-07-30 NOTE — ED Notes (Signed)
Pt made 2nd and final phone call of the day to his sister

## 2020-07-30 NOTE — ED Provider Notes (Signed)
[place] EMERGENCY DEPARTMENT Provider Note   CSN: [id number] Arrival date & time: 07/29/20  2150     History Chief Complaint  Patient presents with  . Suicidal    [facility] is a 39 y.o. male.  Patient presents with complaints of feeling suicidal.  Patient reports that he cut his left arm with a broken piece of glass as a suicide attempt.  He is also hearing voices that are telling him to stab himself in the throat with a knife.        Past Medical History:  Diagnosis Date  . Diabetes mellitus without complication ([place])   . MR (mental retardation)   . Schizoaffective disorder [facility])     Patient Active Problem List   Diagnosis Date Noted  . Schizoaffective disorder ([place]) 05/17/2020  . Diabetes ([place]) 05/17/2020  . HTN (hypertension) 05/17/2020  . Intellectual disability 05/17/2020    Past Surgical History:  Procedure Laterality Date  . APPENDECTOMY    . HIP SURGERY Left        Family History  Problem Relation Age of Onset  . Throat cancer Mother   . Esophageal cancer Mother   . Diabetes Father   . Colon cancer Neg Hx   . Rectal cancer Neg Hx   . Stomach cancer Neg Hx     Social History   Tobacco Use  . Smoking status: Current Every Day Smoker    Packs/day: 0.25    Types: Cigarettes  . Smokeless tobacco: Never Used  Vaping Use  . Vaping Use: Never used  Substance Use Topics  . Alcohol use: No  . Drug use: No    Home Medications Prior to Admission medications   Medication Sig Start Date End Date Taking? Authorizing Provider  acetaminophen (TYLENOL) 325 MG tablet Take 650 mg by mouth every 6 (six) hours as needed for headache.    [provider]  benztropine (COGENTIN) 1 MG tablet Take 1 mg by mouth 2 (two) times daily.    [provider]  cetirizine (ZYRTEC) 10 MG tablet Take 10 mg by mouth daily.    [provider]  clonazePAM (KLONOPIN) 1 MG tablet Take 1 mg by mouth in the morning, at noon,  and at bedtime.    [provider]  divalproex (DEPAKOTE) 250 MG DR tablet Take 250 mg by mouth in the morning.    [provider]  divalproex (DEPAKOTE) 500 MG DR tablet Take 500 mg by mouth at bedtime.    [provider]  docusate sodium (COLACE) 100 MG capsule Take 100 mg by mouth daily.    [provider]  FLUoxetine (PROZAC) 40 MG capsule Take 40 mg by mouth daily.    [provider]  fluticasone (FLONASE) 50 MCG/ACT nasal spray Place 1 spray into both nostrils daily.    [provider]  haloperidol (HALDOL) 5 MG tablet Take 5 mg by mouth 2 (two) times daily.    [provider]  hydrocortisone (ANUSOL-HC) 2.5 % rectal cream Place 1 application rectally 2 (two) times daily. 05/18/20   Esterwood, Amy S, PA-C  hydrocortisone (ANUSOL-HC) 25 MG suppository Place 1 suppository (25 mg total) rectally at bedtime. 05/16/20   Esterwood, Amy S, PA-C  lisinopril (ZESTRIL) 5 MG tablet Take 5 mg by mouth daily.    [provider]  omeprazole (PRILOSEC) 20 MG capsule Take 20 mg by mouth daily.    [provider]  polyethylene glycol powder (MIRALAX) 17  GM/SCOOP powder Take 1 Container by mouth once.    [provider]  propranolol (INDERAL) 10 MG tablet Take 10 mg by mouth 3 (three) times daily.    [provider]    Allergies    Bactrim [sulfamethoxazole-trimethoprim], Latex, Other, and Tomato  Review of Systems   Review of Systems  Skin: Positive for wound.  Psychiatric/Behavioral: Positive for hallucinations and suicidal ideas.  All other systems reviewed and are negative.   Physical Exam Updated Vital Signs BP 128/89   Pulse 69   Temp 99 F (37.2 C) (Oral)   Resp 16   Ht 5\' 11"  (1.803 m)   Wt 120.2 kg   SpO2 100%   BMI 36.96 kg/m   Physical Exam Vitals and nursing note reviewed.  Constitutional:      General: He is not in acute distress.    Appearance: Normal appearance. He is  well-developed.  HENT:     Head: Normocephalic and atraumatic.     Right Ear: Hearing normal.     Left Ear: Hearing normal.     Nose: Nose normal.  Eyes:     Conjunctiva/sclera: Conjunctivae normal.     Pupils: Pupils are equal, round, and reactive to light.  Cardiovascular:     Rate and Rhythm: Regular rhythm.     Heart sounds: S1 normal and S2 normal. No murmur heard.  No friction rub. No gallop.   Pulmonary:     Effort: Pulmonary effort is normal. No respiratory distress.     Breath sounds: Normal breath sounds.  Chest:     Chest wall: No tenderness.  Abdominal:     General: Bowel sounds are normal.     Palpations: Abdomen is soft.     Tenderness: There is no abdominal tenderness. There is no guarding or rebound. Negative signs include Murphy's sign and McBurney's sign.     Hernia: No hernia is present.  Musculoskeletal:        General: Normal range of motion.     Left forearm: Laceration present.     Cervical back: Normal range of motion and neck supple.  Skin:    General: Skin is warm and dry.     Findings: Laceration (Left forearm -extensor surface) present. No rash.  Neurological:     Mental Status: He is alert and oriented to person, place, and time.     GCS: GCS eye subscore is 4. GCS verbal subscore is 5. GCS motor subscore is 6.     Cranial Nerves: No cranial nerve deficit.     Sensory: No sensory deficit.     Coordination: Coordination normal.  Psychiatric:        Speech: Speech normal.        Behavior: Behavior normal.        Thought Content: Thought content includes suicidal ideation.     ED Results / Procedures / Treatments   Labs (all labs ordered are listed, but only abnormal results are displayed) Labs Reviewed  COMPREHENSIVE METABOLIC PANEL - Abnormal; Notable for the following components:      Result Value   Glucose, Bld 105 (*)    All other components within normal limits  SALICYLATE LEVEL - Abnormal; Notable for the following components:    Salicylate Lvl <5.4 (*)    All other components within normal limits  ACETAMINOPHEN LEVEL - Abnormal; Notable for the following components:   Acetaminophen (Tylenol), Serum <10 (*)    All other components within normal limits  ETHANOL  CBC  RAPID URINE DRUG SCREEN, HOSP PERFORMED    EKG None  Radiology No results found.  Procedures .Marland KitchenLaceration Repair  Date/Time: 07/30/2020 3:03 AM Performed by: Orpah Greek, MD Authorized by: Orpah Greek, MD   Consent:    Consent obtained:  Verbal   Consent given by:  Patient   Risks discussed:  Infection and pain Universal protocol:    Procedure explained and questions answered to patient or proxy's satisfaction: yes     Site/side marked: yes     Immediately prior to procedure, a time out was called: yes     Patient identity confirmed:  Verbally with patient Anesthesia (see MAR for exact dosages):    Anesthesia method:  Local infiltration   Local anesthetic:  Lidocaine 2% WITH epi Laceration details:    Location:  Shoulder/arm   Shoulder/arm location:  L lower arm   Length (cm):  10 Repair type:    Repair type:  Intermediate Pre-procedure details:    Preparation:  Patient was prepped and draped in usual sterile fashion Exploration:    Hemostasis achieved with:  Direct pressure and epinephrine   Wound exploration: wound explored through full range of motion and entire depth of wound probed and visualized     Wound extent: no fascia violation noted, no foreign bodies/material noted, no muscle damage noted, no nerve damage noted, no tendon damage noted and no vascular damage noted     Contaminated: no   Treatment:    Area cleansed with:  Betadine   Irrigation solution:  Sterile saline   Irrigation volume:  1L   Irrigation method:  Syringe Subcutaneous repair:    Suture size:  3-0   Suture material:  Vicryl   Suture technique:  Horizontal mattress   Number of sutures:  4 Skin repair:    Repair method:   Sutures   Suture size:  3-0   Suture material:  Nylon   Suture technique:  Simple interrupted   Number of sutures:  8 Approximation:    Approximation:  Close Post-procedure details:    Dressing:  Sterile dressing   Patient tolerance of procedure:  Tolerated well, no immediate complications   (including critical care time)  Medications Ordered in ED Medications  lidocaine-EPINEPHrine (XYLOCAINE W/EPI) 1 %-1:100000 (with pres) injection 10 mL (10 mLs Infiltration Given 07/30/20 0222)    ED Course  I have reviewed the triage vital signs and the nursing notes.  Pertinent labs & imaging results that were available during my care of the patient were reviewed by me and considered in my medical decision making (see chart for details).    MDM Rules/Calculators/A&P                         Patient presents with self-inflicted laceration to left forearm as a suicide attempt.  Patient has a deep and lengthy laceration over the extensor surface of the left forearm.  Exploration, however, reveals that there is no violation of underlying muscles.  Patient has normal extension at the wrist and fingers.  Normal sensation across all III nerve distributions of the hand.  Normal capillary refill.  Normal pulses.  Wound repair performed.  Will require behavioral health evaluation for his stated auditory hallucinations and persistent feelings of wanting to kill himself.   Final Clinical Impression(s) / ED Diagnoses Final diagnoses:  Suicidal ideation  Laceration of left forearm, initial encounter    Rx / DC Orders ED Discharge Orders  None       Orpah Greek, MD 07/30/20 [phone]

## 2020-07-30 NOTE — ED Notes (Signed)
Pt requesting nicotine gum. Notified pharmacy to send gum per patient's request.

## 2020-07-30 NOTE — ED Notes (Signed)
Pt dinner tray delivered

## 2020-07-30 NOTE — ED Notes (Signed)
Breakfast Ordered 

## 2020-07-30 NOTE — Consult Note (Signed)
Telepsych Consultation   Reason for Consult:  Psych consult Referring Physician:  Joseph Berkshire Location of Patient: [place] Location of Provider: [facility]  Patient Identification: Jordan Gray MRN:  [id number] Principal Diagnosis: Intellectual disability Diagnosis:  Principal Problem:   Intellectual disability Active Problems:   Schizoaffective disorder ([place])  Total Time spent with patient: 15 minutes  Subjective:   Jordan Gray is a 39 y.o. male patient admitted with suicidal ideations and suicidal behaviors. Patient cut himself on the arm with glass after a disagreement with ALF staff and insists it was a suicide attempt.  On assessment patient presents concrete and tangential. Patient states, "I don't want to go back to my group home. I don't think my medication is straight. I'm going to try to kill myself if you try to make me go back". Patient states a possible trigger for yesterday's incident was due to "I lost my uncle two months ago and yesterday was the anniversary". "I'm not going back, there was a kid there when I did what I did. He saw me".   HPI:  Per Tele Assessment Note 07/30/20 0505: "Jordan Gray is an 40 y.o. single male who presents unaccompanied to [facility] ED via EMS after cutting his left arm several times with broken glass requiring sutures. Pt states this was a suicide attempt. Pt has a diagnosis of schizoaffective disorder and intellectual disability. He says he has not slept in three days and is experiencing racing thoughts. He states he is seeing shadows and demons and that his mind is telling him to cut his throat and wrists and kill himself. He says he lives an assisted living facility and that he found broken glass in the garbage that he was able to use to cut himself. He says staff called 911. He states he has attempted suicide in the past. Pt acknowledges symptoms including crying spells, social withdrawal, loss of interest in  usual pleasures, fatigue, irritability, decreased concentration, and feelings of worthlessness and hopelessness. He denies current homicidal ideation but states he has a history of being physically aggressive in the past. He denies use of alcohol or other substances"  Pt currently lives in single home ALF via [facility]  Collateral: Heywood Iles: pt's assigned staff member at [facility] [phone] Per Mitzi Hansen around 2100 last night pt was asked to take a shower and became upset when redirected to shower again due to him not properly showering initially. Mitzi Hansen states patient ran outside to trash and picked up glass then cut himself. States patient has been stable with last setback being 01/2020. He endorses knowledge of patient's history of cutting in the past 4 years and states since January of this year it's gotten progressively worse from his baseline. Mitzi Hansen states patient is medication compliant and due to recent incident will have a treatment team with guardian Huntley Estelle [place]) and treatment team staff within the organization this week to develop a crisis plan to prevent this from happening. He further states patient does have prn medications and the providers are looking to make changes this week; says patient is welcomed to return and he would be the person to contact for transport.   Past Psychiatric History: Per EDP:  "Schizoaffective disorder, Intellectual Disability" Last known encounter within [place] [facility] for behavior health reasons is noted 05/11/2016  Risk to Self: Suicidal Ideation: Yes-Currently Present Suicidal Intent: Yes-Currently Present Is patient at risk for suicide?: Yes Suicidal Plan?: Yes-Currently Present Specify Current Suicidal  Plan: Cut his wrist and throat Access to Means: Yes Specify Access to Suicidal Means: Pt access broken glass What has been your use of drugs/alcohol within the last 12 months?: Pt denies How many times?: 3 Other Self Harm Risks:  None Triggers for Past Attempts: Hallucinations Intentional Self Injurious Behavior: Cutting Comment - Self Injurious Behavior: Pt cut left forearm requiring sutures Risk to Others: Homicidal Ideation: No Thoughts of Harm to Others: No Current Homicidal Intent: No Current Homicidal Plan: No Access to Homicidal Means: No Identified Victim: None History of harm to others?: Yes Assessment of Violence: In distant past Violent Behavior Description: Pt reports he has been physically aggressive in the past Does patient have access to weapons?: No Criminal Charges Pending?: No Does patient have a court date: No Prior Inpatient Therapy: Prior Inpatient Therapy: Yes Prior Therapy Dates: 2020, multiple admits Prior Therapy Facilty/Provider(s): [facility], other facilities Reason for Treatment: Schizoaffective disorder Prior Outpatient Therapy: Prior Outpatient Therapy: Yes Prior Therapy Dates: Current Prior Therapy Facilty/Provider(s): unknown Reason for Treatment: Schizoaffective disorder Does patient have an ACCT team?: No Does patient have Intensive In-House Services?  : No Does patient have [facility] services? : No Does patient have P4CC services?: No  Past Medical History:  Past Medical History:  Diagnosis Date  . Diabetes mellitus without complication ([place])   . MR (mental retardation)   . Schizoaffective disorder [facility])     Past Surgical History:  Procedure Laterality Date  . APPENDECTOMY    . HIP SURGERY Left    Family History:  Family History  Problem Relation Age of Onset  . Throat cancer Mother   . Esophageal cancer Mother   . Diabetes Father   . Colon cancer Neg Hx   . Rectal cancer Neg Hx   . Stomach cancer Neg Hx    Family Psychiatric  History: not noted Social History:  Social History   Substance and Sexual Activity  Alcohol Use No     Social History   Substance and Sexual Activity  Drug Use No    Social History   Socioeconomic History  . Marital status:  Single    Spouse name: Not on file  . Number of children: Not on file  . Years of education: Not on file  . Highest education level: Not on file  Occupational History  . Not on file  Tobacco Use  . Smoking status: Current Every Day Smoker    Packs/day: 0.25    Types: Cigarettes  . Smokeless tobacco: Never Used  Vaping Use  . Vaping Use: Never used  Substance and Sexual Activity  . Alcohol use: No  . Drug use: No  . Sexual activity: Not on file  Other Topics Concern  . Not on file  Social History Narrative  . Not on file   Social Determinants of Health   Financial Resource Strain:   . Difficulty of Paying Living Expenses: Not on file  Food Insecurity:   . Worried About Charity fundraiser in the Last Year: Not on file  . Ran Out of Food in the Last Year: Not on file  Transportation Needs:   . Lack of Transportation (Medical): Not on file  . Lack of Transportation (Non-Medical): Not on file  Physical Activity:   . Days of Exercise per Week: Not on file  . Minutes of Exercise per Session: Not on file  Stress:   . Feeling of Stress : Not on file  Social Connections:   . Frequency  of Communication with Friends and Family: Not on file  . Frequency of Social Gatherings with Friends and Family: Not on file  . Attends Religious Services: Not on file  . Active Member of Clubs or Organizations: Not on file  . Attends Archivist Meetings: Not on file  . Marital Status: Not on file   Additional Social History:   Allergies:   Allergies  Allergen Reactions  . Bactrim [Sulfamethoxazole-Trimethoprim]   . Latex   . Other     Nicotine patch  . Tomato     Labs:  Results for orders placed or performed during the hospital encounter of 07/29/20 (from the past 48 hour(s))  Comprehensive metabolic panel     Status: Abnormal   Collection Time: 07/29/20 10:28 PM  Result Value Ref Range   Sodium 137 135 - 145 mmol/L   Potassium 3.7 3.5 - 5.1 mmol/L   Chloride 103 98 -  111 mmol/L   CO2 25 22 - 32 mmol/L   Glucose, Bld 105 (H) 70 - 99 mg/dL    Comment: Glucose reference range applies only to samples taken after fasting for at least 8 hours.   BUN 13 6 - 20 mg/dL   Creatinine, Ser 1.05 0.61 - 1.24 mg/dL   Calcium 9.6 8.9 - 10.3 mg/dL   Total Protein 6.7 6.5 - 8.1 g/dL   Albumin 3.7 3.5 - 5.0 g/dL   AST 16 15 - 41 U/L   ALT 14 0 - 44 U/L   Alkaline Phosphatase 46 38 - 126 U/L   Total Bilirubin 1.0 0.3 - 1.2 mg/dL   GFR, Estimated >60 >60 mL/min    Comment: (NOTE) Calculated using the CKD-EPI Creatinine Equation (2021)    Anion gap 9 5 - 15    Comment: Performed at [facility] [address], [place], [place] [address]  Ethanol     Status: None   Collection Time: 07/29/20 10:28 PM  Result Value Ref Range   Alcohol, Ethyl (B) <10 <10 mg/dL    Comment: (NOTE) Lowest detectable limit for serum alcohol is 10 mg/dL.  For medical purposes only. Performed at [facility], [place] [address] [place], [place] [address]   Salicylate level     Status: Abnormal   Collection Time: 07/29/20 10:28 PM  Result Value Ref Range   Salicylate Lvl <1.8 (L) 7.0 - 30.0 mg/dL    Comment: Performed at [facility] [address] [place], [place] [address]  Acetaminophen level     Status: Abnormal   Collection Time: 07/29/20 10:28 PM  Result Value Ref Range   Acetaminophen (Tylenol), Serum <10 (L) 10 - 30 ug/mL    Comment: (NOTE) Therapeutic concentrations vary significantly. A range of 10-30 ug/mL  may be an effective concentration for many patients. However, some  are best treated at concentrations outside of this range. Acetaminophen concentrations >150 ug/mL at 4 hours after ingestion  and >50 ug/mL at 12 hours after ingestion are often associated with  toxic reactions.  Performed at [facility], [place] [address] [place] [address]   cbc     Status: None   Collection Time: 07/29/20 10:28 PM  Result Value Ref Range   WBC  9.0 4.0 - 10.5 K/uL   RBC 5.36 4.22 - 5.81 MIL/uL   Hemoglobin 16.5 13.0 - 17.0 g/dL   HCT 47.7 39 - 52 %   MCV 89.0 80.0 - 100.0 fL  MCH 30.8 26.0 - 34.0 pg   MCHC 34.6 30.0 - 36.0 g/dL   RDW 13.2 11.5 - 15.5 %   Platelets 270 150 - 400 K/uL   nRBC 0.0 0.0 - 0.2 %    Comment: Performed at [facility], [place] [address], [place], [place] [address]  Rapid urine drug screen (hospital performed)     Status: None   Collection Time: 07/29/20 11:30 PM  Result Value Ref Range   Opiates NONE DETECTED NONE DETECTED   Cocaine NONE DETECTED NONE DETECTED   Benzodiazepines NONE DETECTED NONE DETECTED   Amphetamines NONE DETECTED NONE DETECTED   Tetrahydrocannabinol NONE DETECTED NONE DETECTED   Barbiturates NONE DETECTED NONE DETECTED    Comment: (NOTE) DRUG SCREEN FOR MEDICAL PURPOSES ONLY.  IF CONFIRMATION IS NEEDED FOR ANY PURPOSE, NOTIFY LAB WITHIN 5 DAYS.  LOWEST DETECTABLE LIMITS FOR URINE DRUG SCREEN Drug Class                     Cutoff (ng/mL) Amphetamine and metabolites    1000 Barbiturate and metabolites    200 Benzodiazepine                 191 Tricyclics and metabolites     300 Opiates and metabolites        300 Cocaine and metabolites        300 THC                            50 Performed at [facility], [place] [address] [place], [place] [address]   Respiratory Panel by RT PCR (Flu A&B, Covid) - Nasopharyngeal Swab     Status: None   Collection Time: 07/30/20  3:14 AM   Specimen: Nasopharyngeal Swab  Result Value Ref Range   SARS Coronavirus 2 by RT PCR NEGATIVE NEGATIVE    Comment: (NOTE) SARS-CoV-2 target nucleic acids are NOT DETECTED.  The SARS-CoV-2 RNA is generally detectable in upper respiratoy specimens during the acute phase of infection. The lowest concentration of SARS-CoV-2 viral copies this assay can detect is 131 copies/mL. A negative result does not preclude SARS-Cov-2 infection and should not be used as the sole basis for treatment  or other patient management decisions. A negative result may occur with  improper specimen collection/handling, submission of specimen other than nasopharyngeal swab, presence of viral mutation(s) within the areas targeted by this assay, and inadequate number of viral copies (<131 copies/mL). A negative result must be combined with clinical observations, patient history, and epidemiological information. The expected result is Negative.  Fact Sheet for Patients:  PinkCheek.be  Fact Sheet for Healthcare Providers:  GravelBags.it  This test is no t yet approved or cleared by the [place] FDA and  has been authorized for detection and/or diagnosis of SARS-CoV-2 by FDA under an Emergency Use Authorization (EUA). This EUA will remain  in effect (meaning this test can be used) for the duration of the COVID-19 declaration under Section 564(b)(1) of the Act, 21 U.S.C. section 360bbb-3(b)(1), unless the authorization is terminated or revoked sooner.     Influenza A by PCR NEGATIVE NEGATIVE   Influenza B by PCR NEGATIVE NEGATIVE    Comment: (NOTE) The Xpert Xpress SARS-CoV-2/FLU/RSV assay is intended as an aid in  the diagnosis of influenza from Nasopharyngeal swab specimens and  should not be used as a sole basis for treatment. Nasal washings and  aspirates are  unacceptable for Xpert Xpress SARS-CoV-2/FLU/RSV  testing.  Fact Sheet for Patients: PinkCheek.be  Fact Sheet for Healthcare Providers: GravelBags.it  This test is not yet approved or cleared by the [place] FDA and  has been authorized for detection and/or diagnosis of SARS-CoV-2 by  FDA under an Emergency Use Authorization (EUA). This EUA will remain  in effect (meaning this test can be used) for the duration of the  Covid-19 declaration under Section 564(b)(1) of the Act, 21  U.S.C. section  360bbb-3(b)(1), unless the authorization is  terminated or revoked. Performed at [facility], [place] [address], [place], [place] [address]    Medications:  Current Facility-Administered Medications  Medication Dose Route Frequency Provider Last Rate Last Admin  . benztropine (COGENTIN) tablet 1 mg  1 mg Oral BID Orpah Greek, MD   1 mg at 07/30/20 0324  . clonazePAM (KLONOPIN) tablet 1 mg  1 mg Oral TID Orpah Greek, MD      . divalproex (DEPAKOTE) DR tablet 250 mg  250 mg Oral q AM Orpah Greek, MD   250 mg at 07/30/20 0651  . divalproex (DEPAKOTE) DR tablet 500 mg  500 mg Oral QHS Pollina, Gwenyth Allegra, MD      . FLUoxetine (PROZAC) capsule 40 mg  40 mg Oral Daily Pollina, Gwenyth Allegra, MD      . haloperidol (HALDOL) tablet 5 mg  5 mg Oral BID Orpah Greek, MD   5 mg at 07/30/20 0324  . ibuprofen (ADVIL) tablet 600 mg  600 mg Oral Q8H PRN Orpah Greek, MD   600 mg at 07/30/20 0330  . lisinopril (ZESTRIL) tablet 5 mg  5 mg Oral Daily Pollina, Gwenyth Allegra, MD      . nicotine polacrilex (NICORETTE) gum 2 mg  2 mg Oral PRN Orpah Greek, MD   2 mg at 07/30/20 0452  . pantoprazole (PROTONIX) EC tablet 40 mg  40 mg Oral Daily Pollina, Gwenyth Allegra, MD      . propranolol (INDERAL) tablet 10 mg  10 mg Oral TID Orpah Greek, MD       Current Outpatient Medications  Medication Sig Dispense Refill  . acetaminophen (TYLENOL) 325 MG tablet Take 650 mg by mouth every 6 (six) hours as needed for headache.    . benztropine (COGENTIN) 1 MG tablet Take 1 mg by mouth 2 (two) times daily.    . cetirizine (ZYRTEC) 10 MG tablet Take 10 mg by mouth daily.    . clonazePAM (KLONOPIN) 1 MG tablet Take 1 mg by mouth in the morning, at noon, and at bedtime.    . divalproex (DEPAKOTE) 250 MG DR tablet Take 250 mg by mouth in the morning.    . divalproex (DEPAKOTE) 500 MG DR tablet Take 500 mg by mouth at bedtime.    . docusate sodium  (COLACE) 100 MG capsule Take 100 mg by mouth daily.    Marland Kitchen FLUoxetine (PROZAC) 40 MG capsule Take 40 mg by mouth daily.    . fluticasone (FLONASE) 50 MCG/ACT nasal spray Place 1 spray into both nostrils daily.    . haloperidol (HALDOL) 5 MG tablet Take 5 mg by mouth 2 (two) times daily.    . hydrocortisone (ANUSOL-HC) 2.5 % rectal cream Place 1 application rectally 2 (two) times daily. 30 g 1  . hydrocortisone (ANUSOL-HC) 25 MG suppository Place 1 suppository (25 mg total) rectally at bedtime. 12 suppository 3  . lisinopril (ZESTRIL) 5 MG  tablet Take 5 mg by mouth daily.    Marland Kitchen omeprazole (PRILOSEC) 20 MG capsule Take 20 mg by mouth daily.    . polyethylene glycol powder (MIRALAX) 17 GM/SCOOP powder Take 1 Container by mouth once.    . propranolol (INDERAL) 10 MG tablet Take 10 mg by mouth 3 (three) times daily.     Musculoskeletal: Strength & Muscle Tone: within normal limits Gait & Station: normal Patient leans: N/A  Psychiatric Specialty Exam: Physical Exam Vitals and nursing note reviewed.  Skin:         Comments: Laceration with sutures 2/2 self inflicted injury  Psychiatric:        Mood and Affect: Affect is labile.        Speech: Speech is tangential.        Behavior: Behavior is slowed. Behavior is cooperative.        Cognition and Memory: Cognition is impaired.        Judgment: Judgment is impulsive and inappropriate.     Review of Systems  Blood pressure 116/71, pulse 79, temperature 98.6 F (37 C), temperature source Oral, resp. rate 17, height 5\' 11"  (1.803 m), weight 120.2 kg, SpO2 98 %.Body mass index is 36.96 kg/m.  General Appearance: Casual and Guarded  Eye Contact:  Fair  Speech:  Normal Rate and Pressured  Volume:  Normal  Mood:  Depressed and Irritable  Affect:  Blunt  Thought Process:  Goal Directed  Orientation:  Full (Time, Place, and Person)  Thought Content:  Illogical and concrete  Suicidal Thoughts:  Yes.  without intent/plan  Homicidal Thoughts:   Yes.  without intent/plan  Memory:  Immediate;   Fair Recent;   Fair Remote;   Fair  Judgement:  Impaired  Insight:  Shallow  Psychomotor Activity:  Normal  Concentration:  Concentration: Poor and Attention Span: Poor  Recall:  [employer] of Knowledge:  Poor  Language:  Fair  Akathisia:  NA  Handed:  Right  AIMS (if indicated):     Assets:  Communication Skills Desire for Improvement Financial Resources/Insurance Housing Physical Health Resilience Social Support Transportation Vocational/Educational  ADL's:  Intact  Cognition:  WNL  Sleep:      Treatment Plan Summary: Daily contact with patient to assess and evaluate symptoms and progress in treatment, Medication management and Plan to discharge back to ALF once stabilized on medications.   Disposition: Supportive therapy provided about ongoing stressors. Discussed crisis plan, support from social network, calling 911, coming to the Emergency Department, and calling Suicide Hotline. Keep patient overnight observation for stabilization and re-evaluate tomorrow for disposition.   This service was provided via telemedicine using a 2-way, interactive audio and video technology.  Names of all persons participating in this telemedicine service and their role in this encounter. Name: Oneida Alar Role: PMHNP  Name: Hampton Abbot Role: Attending MD  Name: Heywood Iles Role: ALF staff  Name: Jordan Gray Role: patient    Inda Merlin, NP 07/30/2020 10:28 AM

## 2020-07-31 DIAGNOSIS — S51812A Laceration without foreign body of left forearm, initial encounter: Secondary | ICD-10-CM | POA: Diagnosis not present

## 2020-07-31 LAB — VALPROIC ACID LEVEL: Valproic Acid Lvl: 45 ug/mL — ABNORMAL LOW (ref 50.0–100.0)

## 2020-07-31 NOTE — Care Management (Signed)
Patient called Jordan Gray RA to assisted living for # 1 call.he apologized to Jordan Gray and stated he would not do that again. He wants to come back to the group home.

## 2020-07-31 NOTE — ED Notes (Signed)
Nicorette gum requested from the pharmacy

## 2020-07-31 NOTE — ED Notes (Addendum)
Pt resting and in NAD. Resp even and unlabored. Skin warm and dry. No sitter present with patient. Pt remains in hallway at nurse's station for close monitoring.

## 2020-07-31 NOTE — ED Notes (Signed)
Pt sitting in chair asking this RN to come over. Pt stated he just saw a "white figure with no face" run across the hallway. Pt stated it is talking to him and telling him "to cut my other arm".

## 2020-07-31 NOTE — Consult Note (Signed)
Telepsych Consultation   Reason for Consult:  SI, self injury Referring Physician:  Joseph Berkshire Location of Patient: [place] Location of Provider: [facility]  Patient Identification: Jordan Gray MRN:  [id number] Principal Diagnosis: Intellectual disability Diagnosis:  Principal Problem:   Intellectual disability Active Problems:   Schizoaffective disorder ([place])   Total Time spent with patient: 15 minutes  Subjective:   Jordan Gray is a 39 y.o. male patient admitted with SI and self-injury after disagreement with staff at his ALF. Patient has history of IDD and currently resides at [facility]. Per staff, patient was asked to reshower and began upset then ran outside where he grabbed glass from the trashcan and cut himself on the arm where he required sutures.   On assessment patient continues to state, "I'm not ready. If I go back today, I'll cut myself or try to kill somebody. Nope. I'm not ready to go back. I'm not ready". Patient endorses suicidal/homicidal ideations and auditory/visual hallucinations stating "I'm seeing the devil. Last night I had a nightmare. I'm seeing things".   HPI: Per Tele Assessment Note 07/30/20 0505: "Jordan Gray is an 39 y.o. single male who presents unaccompanied to [facility] ED via EMS after cutting his left arm several times with broken glass requiring sutures. Pt states this was a suicide attempt. Pt has a diagnosis of schizoaffective disorder and intellectual disability. He says he has not slept in three days and is experiencing racing thoughts. He states he is seeing shadows and demons and that his mind is telling him to cut his throat and wrists and kill himself. He says he lives an assisted living facility and that he found broken glass in the garbage that he was able to use to cut himself. He says staff called 911. He states he has attempted suicide in the past. Pt acknowledges symptoms including crying spells,  social withdrawal, loss of interest in usual pleasures, fatigue, irritability, decreased concentration, and feelings of worthlessness and hopelessness. He denies current homicidal ideation but states he has a history of being physically aggressive in the past. He denies use of alcohol or other substances"   Pt currently lives in single home ALF via [facility]  Past Psychiatric History: Intellectual Disability, Schizoaffective Disorder  Risk to Self: Suicidal Ideation: Yes-Currently Present Suicidal Intent: Yes-Currently Present Is patient at risk for suicide?: Yes Suicidal Plan?: Yes-Currently Present Specify Current Suicidal Plan: Cut his wrist and throat Access to Means: Yes Specify Access to Suicidal Means: Pt access broken glass What has been your use of drugs/alcohol within the last 12 months?: Pt denies How many times?: 3 Other Self Harm Risks: None Triggers for Past Attempts: Hallucinations Intentional Self Injurious Behavior: Cutting Comment - Self Injurious Behavior: Pt cut left forearm requiring sutures Risk to Others: Homicidal Ideation: No Thoughts of Harm to Others: No Current Homicidal Intent: No Current Homicidal Plan: No Access to Homicidal Means: No Identified Victim: None History of harm to others?: Yes Assessment of Violence: In distant past Violent Behavior Description: Pt reports he has been physically aggressive in the past Does patient have access to weapons?: No Criminal Charges Pending?: No Does patient have a court date: No Prior Inpatient Therapy: Prior Inpatient Therapy: Yes Prior Therapy Dates: 2020, multiple admits Prior Therapy Facilty/Provider(s): [facility], other facilities Reason for Treatment: Schizoaffective disorder Prior Outpatient Therapy: Prior Outpatient Therapy: Yes Prior Therapy Dates: Current Prior Therapy Facilty/Provider(s): unknown Reason for Treatment: Schizoaffective disorder Does patient have an ACCT  team?: No Does patient  have Intensive In-House Services?  : No Does patient have [facility] services? : No Does patient have P4CC services?: No  Past Medical History:  Past Medical History:  Diagnosis Date  . Diabetes mellitus without complication ([place])   . MR (mental retardation)   . Schizoaffective disorder [facility])     Past Surgical History:  Procedure Laterality Date  . APPENDECTOMY    . HIP SURGERY Left    Family History:  Family History  Problem Relation Age of Onset  . Throat cancer Mother   . Esophageal cancer Mother   . Diabetes Father   . Colon cancer Neg Hx   . Rectal cancer Neg Hx   . Stomach cancer Neg Hx    Family Psychiatric  History: not noted Social History:  Social History   Substance and Sexual Activity  Alcohol Use No     Social History   Substance and Sexual Activity  Drug Use No    Social History   Socioeconomic History  . Marital status: Single    Spouse name: Not on file  . Number of children: Not on file  . Years of education: Not on file  . Highest education level: Not on file  Occupational History  . Not on file  Tobacco Use  . Smoking status: Current Every Day Smoker    Packs/day: 0.25    Types: Cigarettes  . Smokeless tobacco: Never Used  Vaping Use  . Vaping Use: Never used  Substance and Sexual Activity  . Alcohol use: No  . Drug use: No  . Sexual activity: Not on file  Other Topics Concern  . Not on file  Social History Narrative  . Not on file   Social Determinants of Health   Financial Resource Strain:   . Difficulty of Paying Living Expenses: Not on file  Food Insecurity:   . Worried About Charity fundraiser in the Last Year: Not on file  . Ran Out of Food in the Last Year: Not on file  Transportation Needs:   . Lack of Transportation (Medical): Not on file  . Lack of Transportation (Non-Medical): Not on file  Physical Activity:   . Days of Exercise per Week: Not on file  . Minutes of Exercise per Session: Not on file  Stress:   .  Feeling of Stress : Not on file  Social Connections:   . Frequency of Communication with Friends and Family: Not on file  . Frequency of Social Gatherings with Friends and Family: Not on file  . Attends Religious Services: Not on file  . Active Member of Clubs or Organizations: Not on file  . Attends Archivist Meetings: Not on file  . Marital Status: Not on file   Additional Social History:   Allergies:   Allergies  Allergen Reactions  . Bactrim [Sulfamethoxazole-Trimethoprim] Hives  . Bee Venom Swelling  . Latex Hives  . Other     Nicotine patch    Labs:  Results for orders placed or performed during the hospital encounter of 07/29/20 (from the past 48 hour(s))  Comprehensive metabolic panel     Status: Abnormal   Collection Time: 07/29/20 10:28 PM  Result Value Ref Range   Sodium 137 135 - 145 mmol/L   Potassium 3.7 3.5 - 5.1 mmol/L   Chloride 103 98 - 111 mmol/L   CO2 25 22 - 32 mmol/L   Glucose, Bld 105 (H) 70 - 99 mg/dL  Comment: Glucose reference range applies only to samples taken after fasting for at least 8 hours.   BUN 13 6 - 20 mg/dL   Creatinine, Ser 1.05 0.61 - 1.24 mg/dL   Calcium 9.6 8.9 - 10.3 mg/dL   Total Protein 6.7 6.5 - 8.1 g/dL   Albumin 3.7 3.5 - 5.0 g/dL   AST 16 15 - 41 U/L   ALT 14 0 - 44 U/L   Alkaline Phosphatase 46 38 - 126 U/L   Total Bilirubin 1.0 0.3 - 1.2 mg/dL   GFR, Estimated >60 >60 mL/min    Comment: (NOTE) Calculated using the CKD-EPI Creatinine Equation (2021)    Anion gap 9 5 - 15    Comment: Performed at [facility] [address] [place], [place] [address]  Ethanol     Status: None   Collection Time: 07/29/20 10:28 PM  Result Value Ref Range   Alcohol, Ethyl (B) <10 <10 mg/dL    Comment: (NOTE) Lowest detectable limit for serum alcohol is 10 mg/dL.  For medical purposes only. Performed at [facility], [place] [address], [place], [place] [address]   Salicylate level     Status: Abnormal    Collection Time: 07/29/20 10:28 PM  Result Value Ref Range   Salicylate Lvl <3.7 (L) 7.0 - 30.0 mg/dL    Comment: Performed at [facility] [address], [place], [place] [address]  Acetaminophen level     Status: Abnormal   Collection Time: 07/29/20 10:28 PM  Result Value Ref Range   Acetaminophen (Tylenol), Serum <10 (L) 10 - 30 ug/mL    Comment: (NOTE) Therapeutic concentrations vary significantly. A range of 10-30 ug/mL  may be an effective concentration for many patients. However, some  are best treated at concentrations outside of this range. Acetaminophen concentrations >150 ug/mL at 4 hours after ingestion  and >50 ug/mL at 12 hours after ingestion are often associated with  toxic reactions.  Performed at [facility], [place] [address] [place], [place] [address]   cbc     Status: None   Collection Time: 07/29/20 10:28 PM  Result Value Ref Range   WBC 9.0 4.0 - 10.5 K/uL   RBC 5.36 4.22 - 5.81 MIL/uL   Hemoglobin 16.5 13.0 - 17.0 g/dL   HCT 47.7 39 - 52 %   MCV 89.0 80.0 - 100.0 fL   MCH 30.8 26.0 - 34.0 pg   MCHC 34.6 30.0 - 36.0 g/dL   RDW 13.2 11.5 - 15.5 %   Platelets 270 150 - 400 K/uL   nRBC 0.0 0.0 - 0.2 %    Comment: Performed at [facility], [place] [address], [place], [place] [address]  Rapid urine drug screen (hospital performed)     Status: None   Collection Time: 07/29/20 11:30 PM  Result Value Ref Range   Opiates NONE DETECTED NONE DETECTED   Cocaine NONE DETECTED NONE DETECTED   Benzodiazepines NONE DETECTED NONE DETECTED   Amphetamines NONE DETECTED NONE DETECTED   Tetrahydrocannabinol NONE DETECTED NONE DETECTED   Barbiturates NONE DETECTED NONE DETECTED    Comment: (NOTE) DRUG SCREEN FOR MEDICAL PURPOSES ONLY.  IF CONFIRMATION IS NEEDED FOR ANY PURPOSE, NOTIFY LAB WITHIN 5 DAYS.  LOWEST DETECTABLE LIMITS FOR URINE DRUG SCREEN Drug Class                     Cutoff (ng/mL) Amphetamine and metabolites  1000 Barbiturate and  metabolites    200 Benzodiazepine                 536 Tricyclics and metabolites     300 Opiates and metabolites        300 Cocaine and metabolites        300 THC                            50 Performed at [facility], [place] [address] [place], [place] [address]   Respiratory Panel by RT PCR (Flu A&B, Covid) - Nasopharyngeal Swab     Status: None   Collection Time: 07/30/20  3:14 AM   Specimen: Nasopharyngeal Swab  Result Value Ref Range   SARS Coronavirus 2 by RT PCR NEGATIVE NEGATIVE    Comment: (NOTE) SARS-CoV-2 target nucleic acids are NOT DETECTED.  The SARS-CoV-2 RNA is generally detectable in upper respiratoy specimens during the acute phase of infection. The lowest concentration of SARS-CoV-2 viral copies this assay can detect is 131 copies/mL. A negative result does not preclude SARS-Cov-2 infection and should not be used as the sole basis for treatment or other patient management decisions. A negative result may occur with  improper specimen collection/handling, submission of specimen other than nasopharyngeal swab, presence of viral mutation(s) within the areas targeted by this assay, and inadequate number of viral copies (<131 copies/mL). A negative result must be combined with clinical observations, patient history, and epidemiological information. The expected result is Negative.  Fact Sheet for Patients:  PinkCheek.be  Fact Sheet for Healthcare Providers:  GravelBags.it  This test is no t yet approved or cleared by the [place] FDA and  has been authorized for detection and/or diagnosis of SARS-CoV-2 by FDA under an Emergency Use Authorization (EUA). This EUA will remain  in effect (meaning this test can be used) for the duration of the COVID-19 declaration under Section 564(b)(1) of the Act, 21 U.S.C. section 360bbb-3(b)(1), unless the authorization is terminated or revoked sooner.      Influenza A by PCR NEGATIVE NEGATIVE   Influenza B by PCR NEGATIVE NEGATIVE    Comment: (NOTE) The Xpert Xpress SARS-CoV-2/FLU/RSV assay is intended as an aid in  the diagnosis of influenza from Nasopharyngeal swab specimens and  should not be used as a sole basis for treatment. Nasal washings and  aspirates are unacceptable for Xpert Xpress SARS-CoV-2/FLU/RSV  testing.  Fact Sheet for Patients: PinkCheek.be  Fact Sheet for Healthcare Providers: GravelBags.it  This test is not yet approved or cleared by the [place] FDA and  has been authorized for detection and/or diagnosis of SARS-CoV-2 by  FDA under an Emergency Use Authorization (EUA). This EUA will remain  in effect (meaning this test can be used) for the duration of the  Covid-19 declaration under Section 564(b)(1) of the Act, 21  U.S.C. section 360bbb-3(b)(1), unless the authorization is  terminated or revoked. Performed at [facility], [place] [address], [place], [place] [address]     Medications:  Current Facility-Administered Medications  Medication Dose Route Frequency Provider Last Rate Last Admin  . benztropine (COGENTIN) tablet 1 mg  1 mg Oral BID Orpah Greek, MD   1 mg at 07/31/20 1004  . clonazePAM (KLONOPIN) tablet 1 mg  1 mg Oral TID Orpah Greek, MD   1 mg at 07/31/20 1004  . divalproex (DEPAKOTE) DR tablet 250 mg  250 mg  Oral q AM Orpah Greek, MD   250 mg at 07/31/20 0808  . divalproex (DEPAKOTE) DR tablet 500 mg  500 mg Oral QHS Orpah Greek, MD   500 mg at 07/30/20 2253  . FLUoxetine (PROZAC) capsule 40 mg  40 mg Oral Daily Pollina, Gwenyth Allegra, MD   40 mg at 07/31/20 1003  . haloperidol (HALDOL) tablet 5 mg  5 mg Oral BID Orpah Greek, MD   5 mg at 07/31/20 1005  . ibuprofen (ADVIL) tablet 600 mg  600 mg Oral Q8H PRN Orpah Greek, MD   600 mg at 07/30/20 2254  . lisinopril  (ZESTRIL) tablet 5 mg  5 mg Oral Daily Pollina, Gwenyth Allegra, MD   5 mg at 07/31/20 1004  . melatonin tablet 3 mg  3 mg Oral QHS Robinson, [place] N, PA-C   3 mg at 07/30/20 2253  . nicotine polacrilex (NICORETTE) gum 2 mg  2 mg Oral PRN Orpah Greek, MD   2 mg at 07/30/20 2132  . pantoprazole (PROTONIX) EC tablet 40 mg  40 mg Oral Daily Orpah Greek, MD   40 mg at 07/31/20 1005  . propranolol (INDERAL) tablet 10 mg  10 mg Oral TID Orpah Greek, MD   10 mg at 07/31/20 1004   Current Outpatient Medications  Medication Sig Dispense Refill  . acetaminophen (TYLENOL) 325 MG tablet Take 650 mg by mouth every 6 (six) hours as needed for headache.    . benztropine (COGENTIN) 1 MG tablet Take 1 mg by mouth 2 (two) times daily.    . clonazePAM (KLONOPIN) 1 MG tablet Take 1 mg by mouth in the morning, at noon, and at bedtime.    . divalproex (DEPAKOTE) 250 MG DR tablet Take 250 mg by mouth in the morning.    . divalproex (DEPAKOTE) 500 MG DR tablet Take 500 mg by mouth at bedtime.    . docusate sodium (COLACE) 100 MG capsule Take 100 mg by mouth daily.    Marland Kitchen FLUoxetine (PROZAC) 40 MG capsule Take 40 mg by mouth daily.    . haloperidol (HALDOL) 5 MG tablet Take 5 mg by mouth 2 (two) times daily.    Marland Kitchen lisinopril (ZESTRIL) 5 MG tablet Take 5 mg by mouth daily.    . propranolol (INDERAL) 10 MG tablet Take 10 mg by mouth 3 (three) times daily.    . hydrocortisone (ANUSOL-HC) 2.5 % rectal cream Place 1 application rectally 2 (two) times daily. (Patient not taking: Reported on 07/30/2020) 30 g 1  . hydrocortisone (ANUSOL-HC) 25 MG suppository Place 1 suppository (25 mg total) rectally at bedtime. (Patient not taking: Reported on 07/30/2020) 12 suppository 3   Musculoskeletal: Strength & Muscle Tone: within normal limits Gait & Station: normal Patient leans: N/A  Psychiatric Specialty Exam: Physical Exam Vitals and nursing note reviewed.  Psychiatric:        Attention and  Perception: He is inattentive.        Mood and Affect: Affect is flat.        Speech: Speech is tangential.        Behavior: Behavior is cooperative.        Thought Content: Thought content is paranoid. Thought content includes homicidal and suicidal ideation.        Cognition and Memory: Cognition is impaired.        Judgment: Judgment is inappropriate. Judgment is not impulsive.     Review of Systems  Psychiatric/Behavioral:  Positive for agitation, dysphoric mood, self-injury and sleep disturbance.    Blood pressure (!) 132/96, pulse 86, temperature 98 F (36.7 C), temperature source Oral, resp. rate 18, height 5\' 11"  (1.803 m), weight 120.2 kg, SpO2 98 %.Body mass index is 36.96 kg/m.  General Appearance: Guarded  Eye Contact:  Fair  Speech:  Clear and Coherent  Volume:  Normal  Mood:  Dysphoric  Affect:  Blunt and Flat  Thought Process:  Goal Directed  Orientation:  Other:  Place, Day of the Week, Month only  Thought Content:  Illogical, Rumination and Tangential  Suicidal Thoughts:  Yes.  with intent/plan  Homicidal Thoughts:  Yes.  without intent/plan  Memory:  Immediate;   Fair Recent;   Fair  Judgement:  Poor  Insight:  Lacking and Shallow  Psychomotor Activity:  Increased  Concentration:  Concentration: Fair and Attention Span: Fair  Recall:  [employer] of Knowledge:  Poor  Language:  Fair  Akathisia:  Negative  Handed:  Right  AIMS (if indicated):     Assets:  Agricultural consultant Housing Physical Health Resilience Social Support  ADL's:  Intact  Cognition:  Impaired,  Mild  Sleep:      Treatment Plan Summary: Daily contact with patient to assess and evaluate symptoms and progress in treatment, Medication management and Plan to admit to inpatient hospitalization for stabilization. Patient continues to endorse suicidal and homicidal ideations with plans to cut himself or "hurt others".   Disposition: Recommend psychiatric  Inpatient admission when medically cleared. Supportive therapy provided about ongoing stressors. Discussed crisis plan, support from social network, calling 911, coming to the Emergency Department, and calling Suicide Hotline.  This service was provided via telemedicine using a 2-way, interactive audio and video technology.  Names of all persons participating in this telemedicine service and their role in this encounter. Name: Oneida Alar Role: PMHNP  Name: Hampton Abbot Role: Attending MD  Name: Jordan Gray Role: patient  Name:  Role:     Inda Merlin, NP 07/31/2020 11:43 AM

## 2020-07-31 NOTE — ED Notes (Signed)
Pt made second phone call

## 2020-07-31 NOTE — ED Notes (Signed)
Pt removed dressing on right arm while he was moving around. RN looked at arm while the arm was uncovered. New dressing was applied by this EMT per RN.

## 2020-07-31 NOTE — ED Notes (Signed)
Called staffing office in regards to receiving a sitter for pt. At this time staffing office has no available sitters.

## 2020-07-31 NOTE — ED Notes (Signed)
Pt askinf for advil for lt forearm pain  given

## 2020-07-31 NOTE — ED Notes (Signed)
Notified Vance Peper NP at [facility] about pt's increasing anxiety and agitation despite giving daily meds of haldol, inderal, depakote, and prozac asking for a PRN medicine. No new orders at this time. Attempted to move pt to an environment with low stimulation.

## 2020-07-31 NOTE — ED Notes (Signed)
Laceration to left forearm is clean with sutures in place. No redness or drainage assessed. NT placed new dressing on patient. Pt in NAD.

## 2020-07-31 NOTE — ED Notes (Signed)
Pt asking for soe type wound on his lt  Arm dressing be replaced  Informed that the dressing would be changed in the am

## 2020-08-01 DIAGNOSIS — S51812A Laceration without foreign body of left forearm, initial encounter: Secondary | ICD-10-CM | POA: Diagnosis not present

## 2020-08-01 DIAGNOSIS — F250 Schizoaffective disorder, bipolar type: Secondary | ICD-10-CM | POA: Diagnosis not present

## 2020-08-01 MED ORDER — HALOPERIDOL 5 MG PO TABS
10.0000 mg | ORAL_TABLET | Freq: Every day | ORAL | Status: DC
  Administered 2020-08-01 – 2020-08-03 (×3): 10 mg via ORAL
  Filled 2020-08-01 (×3): qty 2

## 2020-08-01 MED ORDER — DIVALPROEX SODIUM 250 MG PO DR TAB
250.0000 mg | DELAYED_RELEASE_TABLET | Freq: Once | ORAL | Status: AC
  Administered 2020-08-01: 250 mg via ORAL
  Filled 2020-08-01: qty 1

## 2020-08-01 MED ORDER — FLUOXETINE HCL 20 MG PO CAPS
60.0000 mg | ORAL_CAPSULE | Freq: Every day | ORAL | Status: DC
  Administered 2020-08-02 – 2020-08-06 (×5): 60 mg via ORAL
  Filled 2020-08-01 (×5): qty 3

## 2020-08-01 MED ORDER — DIVALPROEX SODIUM 250 MG PO DR TAB
500.0000 mg | DELAYED_RELEASE_TABLET | Freq: Every morning | ORAL | Status: DC
  Administered 2020-08-02 – 2020-08-06 (×5): 500 mg via ORAL
  Filled 2020-08-01 (×5): qty 2

## 2020-08-01 MED ORDER — FLUOXETINE HCL 20 MG PO CAPS
20.0000 mg | ORAL_CAPSULE | Freq: Once | ORAL | Status: AC
  Administered 2020-08-01: 20 mg via ORAL
  Filled 2020-08-01: qty 1

## 2020-08-01 MED ORDER — HALOPERIDOL 5 MG PO TABS
5.0000 mg | ORAL_TABLET | Freq: Every day | ORAL | Status: DC
  Administered 2020-08-02 – 2020-08-03 (×2): 5 mg via ORAL
  Filled 2020-08-01 (×2): qty 1

## 2020-08-01 NOTE — Progress Notes (Addendum)
Pt continues to meet inpatient criteria. Per EMR, pt is IDD however there is no documented FS IQ. Referral information has been sent to the following hospitals for review:    [facility]  [facility]  [facility]      Disposition will continue to assist with inpatient placement needs.    Audree Camel, MSW, LCSW, LCAS Clinical Social Worker II Disposition CSW [phone]   UPDATE: CSW spoke with pt's legal guardian Elmon Kirschner, [phone]) and his caregiver Mitzi Hansen [phone]), neither of whom know pt's IQ. Mr [name] will continue to look through pt's medical records and call CSW back. Mitzi Hansen stated that pt has difficulty reading, but if something is read aloud to him, pt will understand and respond appropriately. Caregiver reports pt does have a care coordinator with [facility]. CSW will contact [facility].

## 2020-08-01 NOTE — ED Notes (Signed)
Dress changed to left forearm, area cleaned with NS, Vaseline gauze and NSD applied and covered. 8 sutures intact, no s/s of infection noted.

## 2020-08-01 NOTE — ED Notes (Signed)
Diet was ordered for Lunch. 

## 2020-08-01 NOTE — ED Notes (Signed)
Patient in bed requesting NAD noted.

## 2020-08-01 NOTE — ED Provider Notes (Addendum)
Emergency Medicine Observation Re-evaluation Note  BIRDIE BEVERIDGE is a 39 y.o. male, seen on rounds today.  Pt initially presented to the ED for complaints of Suicidal Currently, the patient is sitting comfortably in bed   Physical Exam  BP 109/70 (BP Location: Right Arm)   Pulse 77   Temp 98.2 F (36.8 C) (Oral)   Resp 18   Ht 5\' 11"  (1.803 m)   Wt 120.2 kg   SpO2 98%   BMI 36.96 kg/m   CONSTITUTIONAL:  well-appearing, NAD NEURO:  Alert and oriented x 3, no focal deficits EYES:  pupils equal and reactive ENT/NECK:  trachea midline, no JVD CARDIO:  reg rate, reg rhythm, well-perfused PULM:  None labored breathing GI/GU:  Abdomen non-distended MSK/SPINE:  No gross deformities, no edema SKIN:   Approximately 10 cm left forearm laceration without any evidence of infection i.e. erythema, warmth, tenderness to palpation.  Sutures are in place.  Evidence of good wound healing is present. PSYCH:  Appropriate speech, appears somewhat anxious.   ED Course / MDM  EKG:    I have reviewed the labs performed to date as well as medications administered while in observation.  Recent changes in the last 24 hours include none.   Plan  Current plan is for psychiatric placement. Patient is not under full IVC at this time.   Patient is awaiting psychiatric placement after suicide attempt with cutting his left forearm.  This is been repaired he is overall well-appearing has some concerns about his wound being infected which is not consistent with my examination of him.  Nurse has placed petroleum gauze over wound. Reassurance given.  He has no further questions at this time.  Is resting comfortably.  Addendum: 12:43 PM  Seen by NP psychiatrist. "Disposition: Patient continues to meet inpatient criteria. CSW informed. I have discussed limited availability of inpatient psychiatric beds with patient and adjusting his medications while in the ED. Unable to reach legal guardian for med history and  consents. Will increase Prozac to 60 mg daily, Depakote to 500 mg BID, and Haldol to 5 mg QAM, 10 mg QHS for continued depressed mood with AVH and intermittent agitation. ED RN and EDP updated."   Tedd Sias, PA 08/01/20 1106    Pati Gallo [place], [place] 08/01/20 1243    Isla Pence, MD 08/01/20 1316

## 2020-08-01 NOTE — Consult Note (Signed)
Telepsych Consultation   Patient Identification: Jordan Gray MRN:  [id number] Principal Diagnosis: Intellectual disability Diagnosis:  Principal Problem:   Intellectual disability Active Problems:   Schizoaffective disorder ([place])   Total Time spent with patient: 45 minutes  HPI:  Reassessment: Patient seen via telepsych. Chart reviewed. Jordan Gray is a 39 year old male with history of schizoaffective disorder and IDD, who presented to [facility] via EMS after suicide attempt via cutting himself.  On assessment today, Jordan Gray reports ongoing depressed mood with suicidal ideation to kill himself via cutting himself. He is calm and cooperative but appears depressed. He states that his uncle passed away two months ago, and he began having suicidal thoughts on Thursday night related to the two month anniversary. He states he did not share this with anyone because "I don't like to talk about those things." He became upset on Friday when the staff member at his [facility] redirected him to take a longer shower. He told the staff member he was going to run away, but then went to a room with broken glass and cut himself with intention of killing himself. He states that when he saw blood everywhere, he became nauseated and went to the staff member for help.   Patient admits to history of aggressive behaviors, with past hospitalization at [facility]. Patient states that he wanted to choke the [facility] staff member who told him to take a longer shower on Friday, but he decided to kill himself instead. Denies HI currently. He does report ongoing SI to kill himself via cutting. He also reports CAH of "funny noises telling me to cut my throat." He also reports VH of a white figure moving around like a ghost. He states inpatient hospitalization has been helpful for him in the past and feels he needs inpatient care. He states he feels comfortable returning to [facility] after treatment. He feels he needs medication adjustments and  "help with my anger and suicidal thoughts."   Patient has been continued on home medications. He states he has been taking these medications "all my life." He states Haldol was helpful for AVH when first started but does not feel it is helpful now. I attempted to contact legal guardian Elgy Redmond [phone] to discuss medication adjustments with HIPAA-compliant voicemail left. VPA level yesterday was 45.  Per TTS assessment 07/30/2020: Jordan Shean Hedrickis an 39 y.o.singlemalewho presents unaccompanied to [facility] ED via EMS after cutting his left arm several times with broken glass requiring sutures. Pt states this was a suicide attempt. Pt has a diagnosis of schizoaffective disorder and intellectual disability. He says he has not slept in three days and is experiencing racing thoughts.He states he is seeing shadows and demons and that his mind is telling him to cut his throat and wrists and kill himself. He says he lives an assisted living facility and that he found broken glass in the garbage that he was able to use to cut himself. He says staff called 911. He states he has attempted suicide in the past.Pt acknowledges symptoms including crying spells, social withdrawal, loss of interest in usual pleasures, fatigue, irritability, decreased concentration, and feelings of worthlessness and hopelessness.He denies current homicidal ideation but states he has a history of being physically aggressive in the past. He denies use of alcohol or other substances  Disposition: Patient continues to meet inpatient criteria. CSW informed. I have discussed limited availability of inpatient psychiatric beds with patient and adjusting his medications while in the ED. Unable to  reach legal guardian for med history and consents. Will increase Prozac to 60 mg daily, Depakote to 500 mg BID, and Haldol to 5 mg QAM, 10 mg QHS for continued depressed mood with AVH and intermittent agitation. ED RN and EDP updated.  Past  Psychiatric History: Schizoaffective disorder, IDD. Admitted to [facility] in [facility].  Risk to Self: Suicidal Ideation: Yes-Currently Present Suicidal Intent: Yes-Currently Present Is patient at risk for suicide?: Yes Suicidal Plan?: Yes-Currently Present Specify Current Suicidal Plan: Cut his wrist and throat Access to Means: Yes Specify Access to Suicidal Means: Pt access broken glass What has been your use of drugs/alcohol within the last 12 months?: Pt denies How many times?: 3 Other Self Harm Risks: None Triggers for Past Attempts: Hallucinations Intentional Self Injurious Behavior: Cutting Comment - Self Injurious Behavior: Pt cut left forearm requiring sutures Risk to Others: Homicidal Ideation: No Thoughts of Harm to Others: No Current Homicidal Intent: No Current Homicidal Plan: No Access to Homicidal Means: No Identified Victim: None History of harm to others?: Yes Assessment of Violence: In distant past Violent Behavior Description: Pt reports he has been physically aggressive in the past Does patient have access to weapons?: No Criminal Charges Pending?: No Does patient have a court date: No Prior Inpatient Therapy: Prior Inpatient Therapy: Yes Prior Therapy Dates: 2020, multiple admits Prior Therapy Facilty/Provider(s): [facility], other facilities Reason for Treatment: Schizoaffective disorder Prior Outpatient Therapy: Prior Outpatient Therapy: Yes Prior Therapy Dates: Current Prior Therapy Facilty/Provider(s): unknown Reason for Treatment: Schizoaffective disorder Does patient have an ACCT team?: No Does patient have Intensive In-House Services?  : No Does patient have [facility] services? : No Does patient have P4CC services?: No  Past Medical History:  Past Medical History:  Diagnosis Date  . Diabetes mellitus without complication ([place])   . MR (mental retardation)   . Schizoaffective disorder [facility])     Past Surgical History:  Procedure Laterality Date  .  APPENDECTOMY    . HIP SURGERY Left    Family History:  Family History  Problem Relation Age of Onset  . Throat cancer Mother   . Esophageal cancer Mother   . Diabetes Father   . Colon cancer Neg Hx   . Rectal cancer Neg Hx   . Stomach cancer Neg Hx    Family Psychiatric  History: Unknown Social History:  Social History   Substance and Sexual Activity  Alcohol Use No     Social History   Substance and Sexual Activity  Drug Use No    Social History   Socioeconomic History  . Marital status: Single    Spouse name: Not on file  . Number of children: Not on file  . Years of education: Not on file  . Highest education level: Not on file  Occupational History  . Not on file  Tobacco Use  . Smoking status: Current Every Day Smoker    Packs/day: 0.25    Types: Cigarettes  . Smokeless tobacco: Never Used  Vaping Use  . Vaping Use: Never used  Substance and Sexual Activity  . Alcohol use: No  . Drug use: No  . Sexual activity: Not on file  Other Topics Concern  . Not on file  Social History Narrative  . Not on file   Social Determinants of Health   Financial Resource Strain:   . Difficulty of Paying Living Expenses: Not on file  Food Insecurity:   . Worried About Charity fundraiser in the Last Year: Not  on file  . Ran Out of Food in the Last Year: Not on file  Transportation Needs:   . Lack of Transportation (Medical): Not on file  . Lack of Transportation (Non-Medical): Not on file  Physical Activity:   . Days of Exercise per Week: Not on file  . Minutes of Exercise per Session: Not on file  Stress:   . Feeling of Stress : Not on file  Social Connections:   . Frequency of Communication with Friends and Family: Not on file  . Frequency of Social Gatherings with Friends and Family: Not on file  . Attends Religious Services: Not on file  . Active Member of Clubs or Organizations: Not on file  . Attends Archivist Meetings: Not on file  . Marital  Status: Not on file   Additional Social History:    Allergies:   Allergies  Allergen Reactions  . Bactrim [Sulfamethoxazole-Trimethoprim] Hives  . Bee Venom Swelling  . Latex Hives  . Other     Nicotine patch    Labs:  Results for orders placed or performed during the hospital encounter of 07/29/20 (from the past 48 hour(s))  Valproic acid level     Status: Abnormal   Collection Time: 07/31/20  2:10 PM  Result Value Ref Range   Valproic Acid Lvl 45 (L) 50.0 - 100.0 ug/mL    Comment: Performed at [facility], [address] [place], [place] [address]    Medications:  Current Facility-Administered Medications  Medication Dose Route Frequency Provider Last Rate Last Admin  . benztropine (COGENTIN) tablet 1 mg  1 mg Oral BID Orpah Greek, MD   1 mg at 08/01/20 1007  . clonazePAM (KLONOPIN) tablet 1 mg  1 mg Oral TID Orpah Greek, MD   1 mg at 08/01/20 1007  . divalproex (DEPAKOTE) DR tablet 250 mg  250 mg Oral q AM Orpah Greek, MD   250 mg at 08/01/20 0716  . divalproex (DEPAKOTE) DR tablet 500 mg  500 mg Oral QHS Orpah Greek, MD   500 mg at 07/31/20 2148  . FLUoxetine (PROZAC) capsule 40 mg  40 mg Oral Daily Pollina, Gwenyth Allegra, MD   40 mg at 08/01/20 1006  . haloperidol (HALDOL) tablet 5 mg  5 mg Oral BID Orpah Greek, MD   5 mg at 08/01/20 1007  . ibuprofen (ADVIL) tablet 600 mg  600 mg Oral Q8H PRN Orpah Greek, MD   600 mg at 08/01/20 0716  . lisinopril (ZESTRIL) tablet 5 mg  5 mg Oral Daily Pollina, Gwenyth Allegra, MD   5 mg at 08/01/20 1007  . melatonin tablet 3 mg  3 mg Oral QHS Robinson, [place] N, PA-C   3 mg at 07/31/20 2151  . nicotine polacrilex (NICORETTE) gum 2 mg  2 mg Oral PRN Orpah Greek, MD   2 mg at 07/31/20 2036  . pantoprazole (PROTONIX) EC tablet 40 mg  40 mg Oral Daily Orpah Greek, MD   40 mg at 08/01/20 1007  . propranolol (INDERAL) tablet 10 mg  10 mg Oral TID  Orpah Greek, MD   10 mg at 08/01/20 1007   Current Outpatient Medications  Medication Sig Dispense Refill  . acetaminophen (TYLENOL) 325 MG tablet Take 650 mg by mouth every 6 (six) hours as needed for headache.    . benztropine (COGENTIN) 1 MG tablet Take 1 mg by mouth 2 (two) times daily.    Marland Kitchen  clonazePAM (KLONOPIN) 1 MG tablet Take 1 mg by mouth in the morning, at noon, and at bedtime.    . divalproex (DEPAKOTE) 250 MG DR tablet Take 250 mg by mouth in the morning.    . divalproex (DEPAKOTE) 500 MG DR tablet Take 500 mg by mouth at bedtime.    . docusate sodium (COLACE) 100 MG capsule Take 100 mg by mouth daily.    Marland Kitchen FLUoxetine (PROZAC) 40 MG capsule Take 40 mg by mouth daily.    . haloperidol (HALDOL) 5 MG tablet Take 5 mg by mouth 2 (two) times daily.    Marland Kitchen lisinopril (ZESTRIL) 5 MG tablet Take 5 mg by mouth daily.    . propranolol (INDERAL) 10 MG tablet Take 10 mg by mouth 3 (three) times daily.    . hydrocortisone (ANUSOL-HC) 2.5 % rectal cream Place 1 application rectally 2 (two) times daily. (Patient not taking: Reported on 07/30/2020) 30 g 1  . hydrocortisone (ANUSOL-HC) 25 MG suppository Place 1 suppository (25 mg total) rectally at bedtime. (Patient not taking: Reported on 07/30/2020) 12 suppository 3     Psychiatric Specialty Exam: Physical Exam  Review of Systems  Blood pressure 109/70, pulse 77, temperature 98.2 F (36.8 C), temperature source Oral, resp. rate 18, height 5\' 11"  (1.803 m), weight 120.2 kg, SpO2 98 %.Body mass index is 36.96 kg/m.  General Appearance: Casual  Eye Contact:  Good  Speech:  Normal Rate  Volume:  Normal  Mood:  Depressed  Affect:  Congruent  Thought Process:  Coherent and Goal Directed  Orientation:  Full (Time, Place, and Person)  Thought Content:  Logical  Suicidal Thoughts:  Yes.  with intent/plan  Homicidal Thoughts:  No  Memory:  Immediate;   Fair Recent;   Fair Remote;   Fair  Judgement:  Poor  Insight:  Fair   Psychomotor Activity:  Normal  Concentration:  Concentration: Fair and Attention Span: Fair  Recall:  [employer] of Knowledge:  Fair  Language:  Fair  Akathisia:  No  Handed:  Right  AIMS (if indicated):     Assets:  Communication Skills Desire for Improvement Housing Social Support  ADL's:  Intact  Cognition:  WNL  Sleep:        Treatment Plan Summary: Daily contact with patient to assess and evaluate symptoms and progress in treatment and Medication management  Disposition: Patient continues to meet inpatient criteria. CSW informed. I have discussed limited availability of inpatient psychiatric beds with patient and adjusting his medications while in the ED. Unable to reach legal guardian for med history and consents. Will increase Prozac to 60 mg daily, Depakote to 500 mg BID, and Haldol to 5 mg QAM, 10 mg QHS for continued depressed mood with AVH and intermittent agitation. ED RN and EDP updated.  This service was provided via telemedicine using a 2-way, interactive audio and video technology.   Connye Burkitt, NP 08/01/2020 11:02 AM

## 2020-08-01 NOTE — ED Notes (Signed)
Patient was given a Media planner and Drink.

## 2020-08-02 DIAGNOSIS — S51812A Laceration without foreign body of left forearm, initial encounter: Secondary | ICD-10-CM | POA: Diagnosis not present

## 2020-08-02 NOTE — ED Notes (Signed)
Lunch tray ordered 

## 2020-08-02 NOTE — ED Notes (Signed)
Tele psych machine at bedside for pt to be assessed.

## 2020-08-02 NOTE — Progress Notes (Signed)
CSW faxed diversion paperwork to [place], fax [phone], to obtain authorization to refer patient to [facility] Dis[facility], CSW [phone] (cell)

## 2020-08-02 NOTE — BH Assessment (Signed)
Reassessment completed on 08/02/20:Patient is a 39 year old male with history of schizoaffective disorder and IDD, who presented to [facility] via EMS after suicide attempt via cutting himself.  Patient is oriented x4, engaged, alert and cooperative. Patient eye contact and tone of voice is normal. Patient dressed in hospital gown sitting up right in the bed. Patient states "I'm still not doing well". Patient reports continued command auditory hallucinations to cut this throat with a knife and visual hallucinations of seeing white images like ghost in his room. Patient reports he is currently seeing images and points towards front of room. Patient continues to have suicidal thoughts. Patient reports having a nightmare last night of him cutting his head off. Patient reports AVH starting when he was little. Patient reports history of SIB and reports when he gets very upset he tends to take the anger out on himself instead of others because he does not want to go to jail. TTS talked with patient about coping skills and patient states "I know about them, I been to a lot of hospitals". Patient is aware of disposition and wants to know when he will transfer to inpatient treatment.   Per Harriett Sine, NP, patient continues to meet criteria for inpatient treatment.

## 2020-08-02 NOTE — ED Notes (Signed)
Lunch Tray Ordered @ 1033. 

## 2020-08-02 NOTE — ED Notes (Signed)
Pt up at nurses station using phone. Sitter present. Will continue to monitor.

## 2020-08-02 NOTE — ED Provider Notes (Signed)
Emergency Medicine Observation Re-evaluation Note  DEMARR KLUEVER is a 39 y.o. male, seen on rounds today.  Pt initially presented to the ED for complaints of Suicidal pt calm, alert, no distress. Does not voice any thoughts of suicide or self harm this AM.  Physical Exam  BP 123/84 (BP Location: Right Arm)   Pulse 71   Temp 97.6 F (36.4 C) (Oral)   Resp 16   Ht 1.803 m (5\' 11" )   Wt 120.2 kg   SpO2 95%   BMI 36.96 kg/m  Physical Exam General: alert, content Cardiac: normal color/appearance.  Lungs: no increased wob.  Psych: alert, content. No active SI.   ED Course / MDM  EKG:    I have reviewed the labs performed to date as well as medications administered while in observation.  Recent changes in the last 24 hours include med management, [place] team reassessment, and continued efforts on placement.   Plan  Current plan is for inpatient psychiatric placement.    Lajean Saver, MD 08/02/20 1025

## 2020-08-03 DIAGNOSIS — S51812A Laceration without foreign body of left forearm, initial encounter: Secondary | ICD-10-CM | POA: Diagnosis not present

## 2020-08-03 MED ORDER — POLYETHYLENE GLYCOL 3350 17 G PO PACK
17.0000 g | PACK | Freq: Every day | ORAL | Status: DC
  Administered 2020-08-03 – 2020-08-06 (×2): 17 g via ORAL
  Filled 2020-08-03 (×3): qty 1

## 2020-08-03 MED ORDER — HALOPERIDOL 5 MG PO TABS
10.0000 mg | ORAL_TABLET | Freq: Every day | ORAL | Status: DC
  Administered 2020-08-04 – 2020-08-06 (×3): 10 mg via ORAL
  Filled 2020-08-03 (×3): qty 2

## 2020-08-03 MED ORDER — ACETAMINOPHEN 500 MG PO TABS
1000.0000 mg | ORAL_TABLET | Freq: Three times a day (TID) | ORAL | Status: DC | PRN
  Administered 2020-08-03 – 2020-08-05 (×4): 1000 mg via ORAL
  Filled 2020-08-03 (×4): qty 2

## 2020-08-03 NOTE — ED Provider Notes (Signed)
Emergency Medicine Observation Re-evaluation Note  Jordan Gray is a 39 y.o. male, seen on rounds today.  Pt initially presented to the ED for complaints of Suicidal Pt calm, alert, no distress.   Physical Exam  BP 126/78 (BP Location: Right Arm)   Pulse 75   Temp 98.3 F (36.8 C) (Oral)   Resp 20   Ht 5\' 11"  (1.803 m)   Wt 120.2 kg   SpO2 100%   BMI 36.96 kg/m  Physical Exam General: alert, content Cardiac: normal color/appearance.  Lungs: no increased wob.  Psych: alert, content.   ED Course / MDM  EKG:EKG Interpretation  Date/Time:  Sunday July 31 2020 11:54:36 EST Ventricular Rate:  81 PR Interval:  152 QRS Duration: 94 QT Interval:  372 QTC Calculation: 432 R Axis:   46 Text Interpretation: Normal sinus rhythm Normal ECG Confirmed by Quintella Reichert [phone]) on 08/02/2020 2:51:44 PM    I have reviewed the labs performed to date as well as medications administered while in observation.  Recent changes in the last 24 hours include med management (prn tylenol for pain associated with wound), [place] team reassessment, and continued efforts on placement.   Plan  Current plan is for inpatient psychiatric placement.      Carlisle Cater, PA-C 08/03/20 0940    Maudie Flakes, MD 08/03/20 [phone]

## 2020-08-03 NOTE — ED Notes (Signed)
Patient has used 2 phone calls since beginning of shift, reminded that he only gets two and this would be his final call of the night.

## 2020-08-03 NOTE — Progress Notes (Addendum)
[place] authorization number: [id number]   State referral packet and diversion exception has been received by [facility] and is currently under review. Phone screening was completed. Admissions staff will call Disposition team back when a decision has been made. Priority placed was requested.     Audree Camel, MSW, LCSW, [facility] Clinical Social Worker II Disposition CSW [phone]

## 2020-08-03 NOTE — ED Notes (Signed)
Patient requesting vital signs to be taken now.

## 2020-08-03 NOTE — ED Notes (Signed)
Patient came out to nurses station to state "my voices are getting worser". When asked to elaborate, patient states "they are telling my to cut more things. To cut everything". Patient continues to be calm and cooperative and returned to his room without issue.

## 2020-08-03 NOTE — ED Notes (Signed)
Pt second breakfast tray was ordered.

## 2020-08-03 NOTE — BHH Counselor (Signed)
Reassessment completed on 08/03/20: Patient is a 39 year old male with history of schizoaffective disorder and IDD, who presented [facility] via [facility] after suicide attempt via cutting himself.  Patient states "still hearing things" clinician had patient to clarify what he was hearing and patient states that the voices continue to tell him to hurt himself. Patient continues to report visual hallucinations and feeling depressed. Patient states that he is not feeling any better. Patient is aware of possible admission to [facility] and sates he was there about a year ago. TTS talked with patient about going to a day program and/or trying to get employment after DC from hospital. Patient also reports stomach cramps.   Disposition: Per Harriett Sine, NP, patient continues to meet criteria for inpatient treatment.

## 2020-08-03 NOTE — Progress Notes (Signed)
CSW received a call from [facility], Admission at [facility], [phone] to inform disposition that patient has not been excepted to [facility].  Stanton Kidney stated that "patient doesn't meet the state hospitals criteria".  CSW to continue searching for other options.  [place] Disposition, CSW [phone] (cell)

## 2020-08-04 ENCOUNTER — Emergency Department (HOSPITAL_COMMUNITY): Payer: Medicare Other

## 2020-08-04 DIAGNOSIS — F250 Schizoaffective disorder, bipolar type: Secondary | ICD-10-CM | POA: Diagnosis not present

## 2020-08-04 DIAGNOSIS — S51812A Laceration without foreign body of left forearm, initial encounter: Secondary | ICD-10-CM | POA: Diagnosis not present

## 2020-08-04 LAB — COMPREHENSIVE METABOLIC PANEL
ALT: 12 U/L (ref 0–44)
AST: 14 U/L — ABNORMAL LOW (ref 15–41)
Albumin: 3.7 g/dL (ref 3.5–5.0)
Alkaline Phosphatase: 42 U/L (ref 38–126)
Anion gap: 12 (ref 5–15)
BUN: 22 mg/dL — ABNORMAL HIGH (ref 6–20)
CO2: 22 mmol/L (ref 22–32)
Calcium: 9.1 mg/dL (ref 8.9–10.3)
Chloride: 98 mmol/L (ref 98–111)
Creatinine, Ser: 1.32 mg/dL — ABNORMAL HIGH (ref 0.61–1.24)
GFR, Estimated: >60 mL/min (ref >60)
Glucose, Bld: 113 mg/dL — ABNORMAL HIGH (ref 70–99)
Potassium: 3.6 mmol/L (ref 3.5–5.1)
Sodium: 132 mmol/L — ABNORMAL LOW (ref 135–145)
Total Bilirubin: 1.2 mg/dL (ref 0.3–1.2)
Total Protein: 6.5 g/dL (ref 6.5–8.1)

## 2020-08-04 LAB — VALPROIC ACID LEVEL: Valproic Acid Lvl: 37 ug/mL — ABNORMAL LOW (ref 50.0–100.0)

## 2020-08-04 LAB — CBC WITH DIFFERENTIAL/PLATELET
Abs Immature Granulocytes: 0.12 10*3/uL — ABNORMAL HIGH (ref 0.00–0.07)
Basophils Absolute: 0 10*3/uL (ref 0.0–0.1)
Basophils Relative: 0 %
Eosinophils Absolute: 0 10*3/uL (ref 0.0–0.5)
Eosinophils Relative: 0 %
HCT: 44 % (ref 39.0–52.0)
Hemoglobin: 15.4 g/dL (ref 13.0–17.0)
Immature Granulocytes: 1 %
Lymphocytes Relative: 5 %
Lymphs Abs: 0.7 10*3/uL (ref 0.7–4.0)
MCH: 30.4 pg (ref 26.0–34.0)
MCHC: 35 g/dL (ref 30.0–36.0)
MCV: 87 fL (ref 80.0–100.0)
Monocytes Absolute: 0.6 10*3/uL (ref 0.1–1.0)
Monocytes Relative: 5 %
Neutro Abs: 12.1 10*3/uL — ABNORMAL HIGH (ref 1.7–7.7)
Neutrophils Relative %: 89 %
Platelets: 214 10*3/uL (ref 150–400)
RBC: 5.06 MIL/uL (ref 4.22–5.81)
RDW: 12.9 % (ref 11.5–15.5)
WBC: 13.5 10*3/uL — ABNORMAL HIGH (ref 4.0–10.5)
nRBC: 0 % (ref 0.0–0.2)

## 2020-08-04 LAB — HEPATIC FUNCTION PANEL
ALT: 13 U/L (ref 0–44)
AST: 15 U/L (ref 15–41)
Albumin: 3.6 g/dL (ref 3.5–5.0)
Alkaline Phosphatase: 45 U/L (ref 38–126)
Bilirubin, Direct: 0.2 mg/dL (ref 0.0–0.2)
Indirect Bilirubin: 1 mg/dL — ABNORMAL HIGH (ref 0.3–0.9)
Total Bilirubin: 1.2 mg/dL (ref 0.3–1.2)
Total Protein: 6.5 g/dL (ref 6.5–8.1)

## 2020-08-04 LAB — RESPIRATORY PANEL BY RT PCR (FLU A&B, COVID)
Influenza A by PCR: NEGATIVE
Influenza B by PCR: NEGATIVE
SARS Coronavirus 2 by RT PCR: NEGATIVE

## 2020-08-04 LAB — LIPASE, BLOOD: Lipase: 25 U/L (ref 11–51)

## 2020-08-04 MED ORDER — DICYCLOMINE HCL 10 MG PO CAPS
20.0000 mg | ORAL_CAPSULE | Freq: Once | ORAL | Status: AC
  Administered 2020-08-04: 20 mg via ORAL
  Filled 2020-08-04: qty 2

## 2020-08-04 MED ORDER — NYSTATIN 100000 UNIT/GM EX POWD
Freq: Two times a day (BID) | CUTANEOUS | Status: DC
  Filled 2020-08-04: qty 15

## 2020-08-04 MED ORDER — IBUPROFEN 400 MG PO TABS
600.0000 mg | ORAL_TABLET | Freq: Three times a day (TID) | ORAL | Status: DC | PRN
  Administered 2020-08-04 – 2020-08-05 (×2): 600 mg via ORAL
  Filled 2020-08-04 (×2): qty 1

## 2020-08-04 MED ORDER — HALOPERIDOL 5 MG PO TABS: 5.0000 mg | ORAL_TABLET | Freq: Every day | ORAL | Status: DC | PRN

## 2020-08-04 MED ORDER — HALOPERIDOL 5 MG PO TABS
15.0000 mg | ORAL_TABLET | Freq: Every day | ORAL | Status: DC
  Administered 2020-08-04 – 2020-08-05 (×2): 15 mg via ORAL
  Filled 2020-08-04 (×2): qty 3

## 2020-08-04 NOTE — ED Provider Notes (Signed)
Emergency Medicine Observation Re-evaluation Note  Jordan Gray is a 39 y.o. male, seen on rounds today.  Pt initially presented to the ED for complaints of Suicidal Patient has been observed in the ED for several days awaiting psych placement. Earlier this afternoon patient ate food from [employer] which he stated smelled off but he hated anyways afterwards he complained of some mild abdominal pain and cramping and had a few episodes of nonbloody diarrhea. Patient was treated with Bentyl and reports resolution of abdominal pain. No nausea or vomiting. Notified by nursing that he then developed a fever later in the evening up to 101.8, which improved with Tylenol. He does state that he has had an occasional cough, denies shortness of breath or chest pain, denies any urinary symptoms, no new or worsening abdominal pain.  Physical Exam  BP 108/66 (BP Location: Right Arm)   Pulse 100   Temp (!) 100.8 F (38.2 C) (Oral)   Resp 20   Ht 5\' 11"  (1.803 m)   Wt 120.2 kg   SpO2 92%   BMI 36.96 kg/m  Physical Exam General: Sitting in bed, calm and in no acute distress, not complaining of any pain, well-appearing Cardiac: Heart with regular rate and rhythm Lungs: Lungs clear to auscultation bilaterally, occasional cough noted Abdomen: Abdomen is soft, nondistended, bowel sounds present throughout, nontender to palpation in all quadrants   ED Course / MDM  EKG:EKG Interpretation  Date/Time:  Sunday July 31 2020 11:54:36 EST Ventricular Rate:  81 PR Interval:  152 QRS Duration: 94 QT Interval:  372 QTC Calculation: 432 R Axis:   46 Text Interpretation: Normal sinus rhythm Normal ECG Confirmed by Quintella Reichert [phone]) on 08/02/2020 2:51:44 PM    Labs Reviewed  COMPREHENSIVE METABOLIC PANEL - Abnormal; Notable for the following components:      Result Value   Glucose, Bld 105 (*)    All other components within normal limits  SALICYLATE LEVEL - Abnormal; Notable for the following  components:   Salicylate Lvl <8.1 (*)    All other components within normal limits  ACETAMINOPHEN LEVEL - Abnormal; Notable for the following components:   Acetaminophen (Tylenol), Serum <10 (*)    All other components within normal limits  VALPROIC ACID LEVEL - Abnormal; Notable for the following components:   Valproic Acid Lvl 45 (*)    All other components within normal limits  CBC WITH DIFFERENTIAL/PLATELET - Abnormal; Notable for the following components:   WBC 13.5 (*)    Neutro Abs 12.1 (*)    Abs Immature Granulocytes 0.12 (*)    All other components within normal limits  RESPIRATORY PANEL BY RT PCR (FLU A&B, COVID)  RESPIRATORY PANEL BY RT PCR (FLU A&B, COVID)  ETHANOL  CBC  RAPID URINE DRUG SCREEN, HOSP PERFORMED  VALPROIC ACID LEVEL  HEPATIC FUNCTION PANEL  COMPREHENSIVE METABOLIC PANEL  LIPASE, BLOOD  CBC WITH DIFFERENTIAL/PLATELET  URINALYSIS, ROUTINE W REFLEX MICROSCOPIC    Plan  Current plan is for additional work-up with labs, chest x-ray and repeat Covid test given fever. Abdominal pain has completely resolved after dose of Bentyl and he has not had any vomiting or additional diarrhea, will hold off on any abdominal imaging pending lab evaluation.  Care signed out to Dr. Billy Fischer pending this work-up.  Patient is under full IVC at this time.   Jacqlyn Larsen, PA-C 08/04/20 2153    Gareth Morgan, MD 08/06/20 1531

## 2020-08-04 NOTE — ED Notes (Signed)
Patient requesting vital signs at 0600, patient attempting to go back to sleep

## 2020-08-04 NOTE — ED Notes (Signed)
Lunch Tray Ordered @ 1034. 

## 2020-08-04 NOTE — ED Notes (Signed)
Dinner Tray Ordered @ 1747. 

## 2020-08-04 NOTE — ED Notes (Signed)
This RN informed PA Ford regarding this pts stomach cramps. PA to assess.

## 2020-08-04 NOTE — Consult Note (Signed)
Telepsych Consultation   Patient Identification: Jordan Gray MRN:  [id number] Principal Diagnosis: Intellectual disability Diagnosis:  Principal Problem:   Intellectual disability Active Problems:   Schizoaffective disorder ([place])   Total Time spent with patient: 45 minutes  HPI:  Reassessment: Patient seen via telepsych. Chart reviewed. Jordan Gray is a 39 year old male with history of schizoaffective disorder and IDD, who presented to [facility] via EMS after suicide attempt via cutting himself.  On assessment today, patient reports, "I'm still hearing and seeing things. When I was on 10 mg of Haldol it seemed to help more." I advised patient that I had increased his Haldol to 10 mg at night several days ago, and patient received his first 10 mg AM dose this morning. We also discussed Depakote and Prozac were increased this week. Patient reports AH of unintelligible mumbling during the day but states voices worsen at night and become CAH to cut his throat. He states voices are the worst when he is trying to fall asleep. He again states voices just started last week and that he had not heard voices for a long time prior to last week. He also continues to report VH of shadows. When asked about thoughts of self-harm, patient says no but then reports ongoing suicidal ideation with no plan. He states that he does not want to act on the voices but is not able to contract for safety at this time. Denies HI.  I attempted to contact patient guardian Jordan Gray [phone]) with HIPAA-compliant voicemail left. I spoke with Jordan Gray, patient's assigned staff member at [facility] [phone]). Jordan Gray says he knows patient has prior history of AH but does not typically seem to be RIS or disturbed by them. He states a neighbor informed him that he saw the patient outside smoking on the day of his self-inflicted injury, and the patient was talking to himself at that time. Jordan Gray also  mentions that patient had a visit on 10/30 with his sister and father, whom he had not seen for 2 years. Jordan Gray reported that the last time patient decompensated, it was related to missing his deceased mother, and he wonders if the family visit could have been a triggering event. Jordan Gray was provided an update on patient and medication changes. He confirms patient is able to return to ALF at time of discharge.  Per TTS assessment 07/30/2020: Jordan Hagmann Hedrickis an 39 y.o.singlemalewho presents unaccompanied [facility] ED via EMS after cutting his left arm several times with broken glass requiring sutures. Pt states this was a suicide attempt. Pt has a diagnosis of schizoaffective disorder and intellectual disability. He says he has not slept in three days and is experiencing racing thoughts.He states he is seeing shadows and demons and that his mind is telling him to cut his throat and wrists and kill himself. He says he lives an assisted living facility and that he found broken glass in the garbage that he was able to use to cut himself. He says staff called 911. He states he has attempted suicide in the past.Pt acknowledges symptoms including crying spells, social withdrawal, loss of interest in usual pleasures, fatigue, irritability, decreased concentration, and feelings of worthlessness and hopelessness.He denies current homicidal ideation but states he has a history of being physically aggressive in the past. He denies use of alcohol or other substances  Disposition: Patient continues to meet inpatient criteria but remains a challenge for placement due to  IDD. I increased nighttime Haldol dose to 15 mg and have also added a PRN Haldol 5 mg for breakthrough hallucinations. VPA level ordered for this evening. Patient appears less depressed than earlier this week. Patient no longer identifies a suicide plan and states he does not want to obey CAH. I discussed with patient that inpatient  hospitalization is not a likely option at this point and that I am hopeful with medication changes that CAH will resolve and patient can discharge from the ED to his AFL in the next day or two, and patient states understanding. ED staff and AFL staff updated.  Past Psychiatric History: See above  Risk to Self: Suicidal Ideation: Yes-Currently Present Suicidal Intent: Yes-Currently Present Is patient at risk for suicide?: Yes Suicidal Plan?: Yes-Currently Present Specify Current Suicidal Plan: Cut his wrist and throat Access to Means: Yes Specify Access to Suicidal Means: Pt access broken glass What has been your use of drugs/alcohol within the last 12 months?: Pt denies How many times?: 3 Other Self Harm Risks: None Triggers for Past Attempts: Hallucinations Intentional Self Injurious Behavior: Cutting Comment - Self Injurious Behavior: Pt cut left forearm requiring sutures Risk to Others: Homicidal Ideation: No Thoughts of Harm to Others: No Current Homicidal Intent: No Current Homicidal Plan: No Access to Homicidal Means: No Identified Victim: None History of harm to others?: Yes Assessment of Violence: In distant past Violent Behavior Description: Pt reports he has been physically aggressive in the past Does patient have access to weapons?: No Criminal Charges Pending?: No Does patient have a court date: No Prior Inpatient Therapy: Prior Inpatient Therapy: Yes Prior Therapy Dates: 2020, multiple admits Prior Therapy Facilty/Provider(s): [facility], other facilities Reason for Treatment: Schizoaffective disorder Prior Outpatient Therapy: Prior Outpatient Therapy: Yes Prior Therapy Dates: Current Prior Therapy Facilty/Provider(s): unknown Reason for Treatment: Schizoaffective disorder Does patient have an ACCT team?: No Does patient have Intensive In-House Services?  : No Does patient have [facility] services? : No Does patient have P4CC services?: No  Past Medical History:   Past Medical History:  Diagnosis Date  . Diabetes mellitus without complication ([place])   . MR (mental retardation)   . Schizoaffective disorder [facility])     Past Surgical History:  Procedure Laterality Date  . APPENDECTOMY    . HIP SURGERY Left    Family History:  Family History  Problem Relation Age of Onset  . Throat cancer Mother   . Esophageal cancer Mother   . Diabetes Father   . Colon cancer Neg Hx   . Rectal cancer Neg Hx   . Stomach cancer Neg Hx    Family Psychiatric  History: Unknown Social History:  Social History   Substance and Sexual Activity  Alcohol Use No     Social History   Substance and Sexual Activity  Drug Use No    Social History   Socioeconomic History  . Marital status: Single    Spouse name: Not on file  . Number of children: Not on file  . Years of education: Not on file  . Highest education level: Not on file  Occupational History  . Not on file  Tobacco Use  . Smoking status: Current Every Day Smoker    Packs/day: 0.25    Types: Cigarettes  . Smokeless tobacco: Never Used  Vaping Use  . Vaping Use: Never used  Substance and Sexual Activity  . Alcohol use: No  . Drug use: No  . Sexual activity: Not on file  Other  Topics Concern  . Not on file  Social History Narrative  . Not on file   Social Determinants of Health   Financial Resource Strain:   . Difficulty of Paying Living Expenses: Not on file  Food Insecurity:   . Worried About Charity fundraiser in the Last Year: Not on file  . Ran Out of Food in the Last Year: Not on file  Transportation Needs:   . Lack of Transportation (Medical): Not on file  . Lack of Transportation (Non-Medical): Not on file  Physical Activity:   . Days of Exercise per Week: Not on file  . Minutes of Exercise per Session: Not on file  Stress:   . Feeling of Stress : Not on file  Social Connections:   . Frequency of Communication with Friends and Family: Not on file  . Frequency of Social  Gatherings with Friends and Family: Not on file  . Attends Religious Services: Not on file  . Active Member of Clubs or Organizations: Not on file  . Attends Archivist Meetings: Not on file  . Marital Status: Not on file   Additional Social History:    Allergies:   Allergies  Allergen Reactions  . Bactrim [Sulfamethoxazole-Trimethoprim] Hives  . Bee Venom Swelling  . Latex Hives  . Other     Nicotine patch    Labs: No results found for this or any previous visit (from the past 48 hour(s)).  Medications:  Current Facility-Administered Medications  Medication Dose Route Frequency Provider Last Rate Last Admin  . acetaminophen (TYLENOL) tablet 1,000 mg  1,000 mg Oral Q8H PRN Carlisle Cater, PA-C   1,000 mg at 08/03/20 1920  . benztropine (COGENTIN) tablet 1 mg  1 mg Oral BID Orpah Greek, MD   1 mg at 08/04/20 1020  . clonazePAM (KLONOPIN) tablet 1 mg  1 mg Oral TID Orpah Greek, MD   1 mg at 08/04/20 1018  . divalproex (DEPAKOTE) DR tablet 500 mg  500 mg Oral QHS Orpah Greek, MD   500 mg at 08/03/20 2111  . divalproex (DEPAKOTE) DR tablet 500 mg  500 mg Oral q AM Connye Burkitt, NP   500 mg at 08/04/20 0715  . FLUoxetine (PROZAC) capsule 60 mg  60 mg Oral Daily Connye Burkitt, NP   60 mg at 08/04/20 1016  . haloperidol (HALDOL) tablet 10 mg  10 mg Oral QHS Connye Burkitt, NP   10 mg at 08/03/20 2110  . haloperidol (HALDOL) tablet 10 mg  10 mg Oral Daily Connye Burkitt, NP   10 mg at 08/04/20 1014  . ibuprofen (ADVIL) tablet 600 mg  600 mg Oral Q8H PRN Orpah Greek, MD   600 mg at 08/04/20 0355  . lisinopril (ZESTRIL) tablet 5 mg  5 mg Oral Daily Pollina, Gwenyth Allegra, MD   5 mg at 08/04/20 1017  . melatonin tablet 3 mg  3 mg Oral QHS Robinson, [place] N, PA-C   3 mg at 08/03/20 2110  . nicotine polacrilex (NICORETTE) gum 2 mg  2 mg Oral PRN Orpah Greek, MD   2 mg at 08/03/20 1954  . pantoprazole (PROTONIX) EC tablet 40  mg  40 mg Oral Daily Orpah Greek, MD   40 mg at 08/04/20 1020  . polyethylene glycol (MIRALAX / GLYCOLAX) packet 17 g  17 g Oral Daily Fredia Sorrow, MD   17 g at 08/03/20 2013  . propranolol (INDERAL)  tablet 10 mg  10 mg Oral TID Orpah Greek, MD   10 mg at 08/04/20 1020   Current Outpatient Medications  Medication Sig Dispense Refill  . acetaminophen (TYLENOL) 325 MG tablet Take 650 mg by mouth every 6 (six) hours as needed for headache.    . benztropine (COGENTIN) 1 MG tablet Take 1 mg by mouth 2 (two) times daily.    . clonazePAM (KLONOPIN) 1 MG tablet Take 1 mg by mouth in the morning, at noon, and at bedtime.    . divalproex (DEPAKOTE) 250 MG DR tablet Take 250 mg by mouth in the morning.    . divalproex (DEPAKOTE) 500 MG DR tablet Take 500 mg by mouth at bedtime.    . docusate sodium (COLACE) 100 MG capsule Take 100 mg by mouth daily.    Marland Kitchen FLUoxetine (PROZAC) 40 MG capsule Take 40 mg by mouth daily.    . haloperidol (HALDOL) 5 MG tablet Take 5 mg by mouth 2 (two) times daily.    Marland Kitchen lisinopril (ZESTRIL) 5 MG tablet Take 5 mg by mouth daily.    . propranolol (INDERAL) 10 MG tablet Take 10 mg by mouth 3 (three) times daily.    . hydrocortisone (ANUSOL-HC) 2.5 % rectal cream Place 1 application rectally 2 (two) times daily. (Patient not taking: Reported on 07/30/2020) 30 g 1  . hydrocortisone (ANUSOL-HC) 25 MG suppository Place 1 suppository (25 mg total) rectally at bedtime. (Patient not taking: Reported on 07/30/2020) 12 suppository 3     Psychiatric Specialty Exam: Physical Exam  Review of Systems  Blood pressure 119/79, pulse 80, temperature 97.8 F (36.6 C), temperature source Oral, resp. rate 18, height 5\' 11"  (1.803 m), weight 120.2 kg, SpO2 95 %.Body mass index is 36.96 kg/m.  General Appearance: Casual  Eye Contact:  Good  Speech:  Normal Rate  Volume:  Normal  Mood:  Depressed  Affect:  Constricted  Thought Process:  Coherent  Orientation:  Full  (Time, Place, and Person)  Thought Content:  Logical  Suicidal Thoughts:  Yes.  without intent/plan  Homicidal Thoughts:  No  Memory:  Immediate;   Fair Recent;   Fair  Judgement:  Intact  Insight:  Fair  Psychomotor Activity:  Normal  Concentration:  Concentration: Fair and Attention Span: Fair  Recall:  [employer] of Knowledge:  Fair  Language:  Fair  Akathisia:  No  Handed:  Right  AIMS (if indicated):     Assets:  Communication Skills Desire for Improvement Financial Resources/Insurance Housing Leisure Time Resilience Social Support  ADL's:  Intact  Cognition:  WNL  Sleep:       Disposition: Patient continues to meet inpatient criteria but remains a challenge for placement due to IDD. I increased nighttime Haldol dose to 15 mg and have also added a PRN Haldol 5 mg for breakthrough hallucinations. VPA level ordered for this evening. Patient appears less depressed than earlier this week. Patient no longer identifies a suicide plan and states he does not want to obey CAH. I discussed with patient that inpatient hospitalization is not a likely option at this point and that I am hopeful with medication changes that CAH will resolve and patient can discharge from the ED to his [facility] in the next day or two, and patient states understanding. ED staff and AFL staff updated.  This service was provided via telemedicine using a 2-way, interactive audio and video technology.   Connye Burkitt, NP 08/04/2020 10:20 AM

## 2020-08-04 NOTE — Progress Notes (Signed)
CSW spoke with pt's legal guardian Elmon Kirschner [phone]) and informed him that pt has been declined at [facility] and that their recommendation was to move forward with a [facility] referral. Mr [name] stated that he will let his team know, and that they will begin working on the application for that facility. He also stated that if pt were to stabilize and no longer meet psychiatric inpatient criteria, he would be able to return to his ALF. "We just wanted to make sure his medications are correct and that he's no longer aggressive." Guardian was informed that pt has been calm and cooperative while in the ED.    Audree Camel, MSW, LCSW, [place] Clinical Social Worker II Disposition CSW [phone]

## 2020-08-05 DIAGNOSIS — S51812A Laceration without foreign body of left forearm, initial encounter: Secondary | ICD-10-CM | POA: Diagnosis not present

## 2020-08-05 LAB — CBC WITH DIFFERENTIAL/PLATELET
Abs Immature Granulocytes: 0.09 10*3/uL — ABNORMAL HIGH (ref 0.00–0.07)
Basophils Absolute: 0 10*3/uL (ref 0.0–0.1)
Basophils Relative: 0 %
Eosinophils Absolute: 0 10*3/uL (ref 0.0–0.5)
Eosinophils Relative: 0 %
HCT: 43 % (ref 39.0–52.0)
Hemoglobin: 15.1 g/dL (ref 13.0–17.0)
Immature Granulocytes: 1 %
Lymphocytes Relative: 9 %
Lymphs Abs: 0.9 10*3/uL (ref 0.7–4.0)
MCH: 30.3 pg (ref 26.0–34.0)
MCHC: 35.1 g/dL (ref 30.0–36.0)
MCV: 86.3 fL (ref 80.0–100.0)
Monocytes Absolute: 0.6 10*3/uL (ref 0.1–1.0)
Monocytes Relative: 6 %
Neutro Abs: 8.8 10*3/uL — ABNORMAL HIGH (ref 1.7–7.7)
Neutrophils Relative %: 84 %
Platelets: 247 10*3/uL (ref 150–400)
RBC: 4.98 MIL/uL (ref 4.22–5.81)
RDW: 13 % (ref 11.5–15.5)
WBC: 10.5 10*3/uL (ref 4.0–10.5)
nRBC: 0 % (ref 0.0–0.2)

## 2020-08-05 LAB — URINALYSIS, ROUTINE W REFLEX MICROSCOPIC
Bacteria, UA: NONE SEEN
Bilirubin Urine: NEGATIVE
Glucose, UA: NEGATIVE mg/dL
Hgb urine dipstick: NEGATIVE
Ketones, ur: NEGATIVE mg/dL
Leukocytes,Ua: NEGATIVE
Nitrite: NEGATIVE
Protein, ur: 30 mg/dL — AB
Specific Gravity, Urine: 1.017 (ref 1.005–1.030)
pH: 5 (ref 5.0–8.0)

## 2020-08-05 MED ORDER — LOPERAMIDE HCL 2 MG PO CAPS
2.0000 mg | ORAL_CAPSULE | Freq: Four times a day (QID) | ORAL | Status: DC | PRN
  Administered 2020-08-05: 2 mg via ORAL
  Filled 2020-08-05: qty 1

## 2020-08-05 MED ORDER — SODIUM CHLORIDE 0.9 % IV BOLUS
1000.0000 mL | Freq: Once | INTRAVENOUS | Status: AC
  Administered 2020-08-05: 1000 mL via INTRAVENOUS

## 2020-08-05 NOTE — ED Notes (Signed)
This RN asked MD Billy Fischer if she would like this pt to be catheterized to obtain urine sample; MD to come and assess pt first and catheterize if needed.

## 2020-08-05 NOTE — ED Provider Notes (Addendum)
Emergency Medicine Observation Re-evaluation Note  Jordan Gray is a 39 y.o. male, seen on rounds today.  Pt initially presented to the ED for complaints of Suicidal Currently, the patient is awaiting placement.  Patient complained of abdominal pain last night after eating [place] sandwich, was also tachycardic to 102 and febrile to 100.2 yesterday, was given Tylenol and Bentyl, after that patient was not complaining of abdominal pain anymore, pulse now 100, temperature 99.4.  Blood pressure 99/57.  Currently awaiting repeat CBC, will also give fluids at this time.  Urinalysis without any signs of infection, white blood count 13.5, CMP with electrode derangements including sodium of 132, creatinine of 1.32.  In speaking to patient he states that he has had some abdominal pain still, states that it severely helped after Tylenol, also states that he has had some diarrhea.  No bloody diarrhea.  No vomiting or nausea.  Otherwise states that he feels fine.  After speaking to nursing, another patient in that area also had food poisoning symptoms after eating meal tray last night.   Physical Exam  BP (!) 99/57   Pulse 100   Temp 99.4 F (37.4 C) (Oral)   Resp 16   Ht 5\' 11"  (1.803 m)   Wt 120.2 kg   SpO2 96%   BMI 36.96 kg/m  Physical Exam General: Patient appears well, is complaining of mild abdominal discomfort.  No abdominal tenderness. Cardiac: Regular rate rhythm Lungs: No respiratory distress Abdomen: No abdominal tenderness, soft, nondistended. PsychHowell Rucks male  ED Course / MDM  EKG:EKG Interpretation  Date/Time:  Sunday July 31 2020 11:54:36 EST Ventricular Rate:  81 PR Interval:  152 QRS Duration: 94 QT Interval:  372 QTC Calculation: 432 R Axis:   46 Text Interpretation: Normal sinus rhythm Normal ECG Confirmed by Quintella Reichert [phone]) on 08/02/2020 2:51:44 PM    I have reviewed the labs performed to date as well as medications administered while in observation.   Recent changes in the last 24 hours include fluids which will help improve small AKI, hyponatremia, tachycardia.  I think patient most likely has food poisoning, however will repeat CBC and reevaluate.  Repeat CBC count of 10.5, abdominal exam without any peritoneal signs or focal tenderness.  Patient appears very well, no concerns of any acute abdominal pathologies.  Plan  Current plan is for awaiting placement. Patient is under full IVC at this time.       Alfredia Client, PA-C 08/05/20 1302    Breck Coons, MD 08/05/20 2037

## 2020-08-05 NOTE — [facility] Counselor (Addendum)
Re-assessment:   Patient is calm and cooperative during re-assessment. Patient denied suicidal/homicidal ideations and denied auditory/visual hallucinations. Patient stated, "I am tired of hurting myself."   Disposition: Oneida Alar, NP, patient is psych-cleared

## 2020-08-05 NOTE — ED Notes (Signed)
Lunch Tray Ordered @ 1135. 

## 2020-08-05 NOTE — ED Notes (Signed)
This RN made PA Ford aware of this patients elevated fever and treatment; PA to place new orders.

## 2020-08-06 DIAGNOSIS — S51812A Laceration without foreign body of left forearm, initial encounter: Secondary | ICD-10-CM | POA: Diagnosis not present

## 2020-08-06 MED ORDER — DIVALPROEX SODIUM 250 MG PO DR TAB: 250.0000 mg | DELAYED_RELEASE_TABLET | Freq: Every morning | ORAL | 0 refills | Status: DC

## 2020-08-06 MED ORDER — FLUOXETINE HCL 60 MG PO TABS: 60.0000 mg | ORAL_TABLET | Freq: Every day | ORAL | 0 refills | Status: DC

## 2020-08-06 NOTE — ED Provider Notes (Addendum)
Emergency Medicine Observation Re-evaluation Note  Jordan Gray is a 39 y.o. male, seen on rounds today.  Pt initially presented to the ED for complaints of Suicidal Currently, the patient is awaiting for placement.  Review of provider notes  07/30/2020: Presented after suicide attempt with a laceration to left arm.  4 deep sutures and 8 external sutures placed.  He met inpatient criteria psychiatric and has been holding since.  08/01/2020: Patient continued waiting for placement.  08/02/2020: Patient continued waiting for placement.  08/03/2020: Patient continued waiting for placement.  08/04/2020: Patient continued waiting for placement.  Believes that he developed food poisoning.  Had some abdominal pain and cramping with diarrhea.  Improved with Bentyl.  Had fever of 101.8 which improved with Tylenol.  Labs were recollected around 9:30 PM on 08/04/2020.  Covid/influenza panel negative Lipase within normal limits. CMP showed mild increase in creatinine at 1.32, no emergent electrolyte derangement, LFT elevations or gap.  Patient was given 1 L normal saline fluid bolus Urinalysis without evidence of infection. CBC with differential showed mild leukocytosis of 13.5, no anemia Chest x-ray:  IMPRESSION:  No active disease.    08/05/2020: Patient continued waiting for placement. CBC recollected, shows no leukocytosis or anemia.  Last night 08/05/2020 at 10:01 PM patient had fever documented of 103.3 F and tachycardia at 105.  No hypotension hypoxia or tachypnea.  He did not receive any antipyretics per chart review.  Fever improved this morning at 6:52 AM at 100 F.  No tachycardia or hypotension.  Physical Exam  BP 132/78 (BP Location: Right Arm)   Pulse 91   Temp 100 F (37.8 C) (Oral)   Resp 17   Ht $R'5\' 11"'lL$  (1.803 m)   Wt 120.2 kg   SpO2 93%   BMI 36.96 kg/m  Physical Exam General: Alert, pleasant Lungs: No increased work of breathing, airway clear Psych: Cooperative,  calm Abdomen: Soft nontender without peritoneal signs or distention Skin: Laceration appears clean, no drainage, fluctuance, significant erythema, induration, dehiscence or other signs of infection. Musculoskeletal: Full range of motion and strength with movements of the left upper extremity without pain.  This includes extension at the left wrist without pain.  ED Course / MDM  EKG:EKG Interpretation  Date/Time:  Friday August 05 2020 16:05:57 EST Ventricular Rate:  96 PR Interval:  152 QRS Duration: 84 QT Interval:  354 QTC Calculation: 447 R Axis:   52 Text Interpretation: Normal sinus rhythm Normal ECG Confirmed by Ripley Fraise [phone]) on 08/06/2020 8:32:15 AM    I have reviewed the labs performed to date as well as medications administered while in observation.  Recent changes in the last 24 hours which have been reviewed above.  Plan  Current plan is for awaiting psychiatric.  Patient had a fever last night unclear cause.  He is asymptomatic.  On exam he is well-appearing no acute distress, denies any complaints and wishes to be discharged.  His fever appears to have resolved spontaneously.  He is no longer experiencing any abdominal pain or diarrhea as he was a few days ago.  Abdomen is completely nontender on exam and he ate breakfast this morning and reports to me that it tasted good and he enjoyed it and had no nausea vomiting diarrhea or abdominal pain.  He has no evidence of infection at his laceration site.  No respiratory symptoms to suggest URI.  I discussed case with Dr. Tomi Bamberger, considering patient without symptoms and that his fever is resolved there  is no indication for further medical work-up at this point.  Will continue to monitor while patient is in the ED.   Note: Portions of this report may have been transcribed using voice recognition software. Every effort was made to ensure accuracy; however, inadvertent computerized transcription errors may still be  present.   Deliah Boston, PA-C 08/06/20 1013    Gari Crown 08/06/20 1110    Dorie Rank, MD 08/07/20 [phone]

## 2020-08-06 NOTE — ED Notes (Signed)
Patient verbalizes understanding of discharge instructions. Opportunity for questioning and answers were provided. Patient leaving with member from his group home. Armband removed by staff, pt discharged from ED.

## 2020-08-06 NOTE — Discharge Instructions (Addendum)
At this time there does not appear to be the presence of an emergent medical condition, however there is always the potential for conditions to change. Please read and follow the below instructions.  Please return to the Emergency Department immediately for any new or worsening symptoms. Please be sure to follow up with your Primary Care Provider within one week regarding your visit today; please call their office to schedule an appointment even if you are feeling better for a follow-up visit. Please follow-up with your psychiatric specialists. Your sutures will need to be removed 2 days.  You may return to the ER to have your sutures removed or you can follow-up with your primary care doctor or urgent care for suture removal.  Go to the nearest Emergency Department immediately if: You have fever or chills You have very bad swelling around the wound. Your pain suddenly gets worse and is very bad. You notice painful lumps near the wound or anywhere on your body. You have a red streak going away from your wound. The wound is on your hand or foot, and: You cannot move a finger or toe. Your fingers or toes look pale or bluish. You are talking about suicide or wishing to die. You start making plans for how to commit suicide. You feel that you have no reason to live. You start making plans for putting your affairs in order, saying goodbye, or giving your possessions away. You feel guilt, shame, or unbearable pain, and it seems like there is no way out. You are frequently using drugs or alcohol. You are engaging in risky behaviors that could lead to death. You have any new/concerning or worsening of symptoms  Please read the additional information packets attached to your discharge summary.  Do not take your medicine if  develop an itchy rash, swelling in your mouth or lips, or difficulty breathing; call 911 and seek immediate emergency medical attention if this occurs.  You may review your lab  tests and imaging results in their entirety on your MyChart account.  Please discuss all results of fully with your primary care provider and other specialist at your follow-up visit.  Note: Portions of this text may have been transcribed using voice recognition software. Every effort was made to ensure accuracy; however, inadvertent computerized transcription errors may still be present.

## 2020-08-06 NOTE — TOC Transition Note (Signed)
Transition of Care [facility]) - CM/SW Discharge Note   Patient Details  Name: Jordan Gray MRN: [id number] Date of Birth: 08-06-1981  [facility]) CM/SW Contact:  Verdell Carmine, RN Phone Number: 08/06/2020, 2:42 PM   Clinical Narrative:    Resident leader at group home called concerned because there were no medication changes. Upon review of chart and reflection of medications that patient was on when he initially presented to the ED and when he left several medications were changed by [facility]. Discussed with BAthena Masse PAC. Agreed to print scripts for increase in depakote with decreased valoproic acid level as well as increase in prozac, however did not have a comfort level with increase in haldol , lookijng at EKG and all other. He wants the patient to receive this from [facility] or [facility] . Made this clear to Mitzi Hansen, the resident leader, phone number [phone].  He will pick up printed scripts for the patient.         Patient Goals and CMS Choice        Discharge Placement                       Discharge Plan and Services                                     Social Determinants of Health (SDOH) Interventions     Readmission Risk Interventions No flowsheet data found.

## 2020-08-06 NOTE — ED Notes (Signed)
PA at bedside for assessment

## 2020-08-06 NOTE — Progress Notes (Signed)
MSW Intern left VM for guardian Huntley Estelle Redmond@[phone] and at crisis # listed on guardian's VM, @[phone] regarding patient's discharge.  MSW Intern contacted group home staff [name] [phone]. He advised that patient is able to return and can be picked up; will be here to pick up patient between 1:00 - 1:30 pm today.

## 2020-08-06 NOTE — ED Provider Notes (Addendum)
I received call from social work informing that patient had been psych cleared yesterday afternoon.  I reviewed patient's chart and there does appear to be a [facility] counselor nore by Despina Hidden filed at 3:22 PM yesterday which states that patient has been psych cleared by Oneida Alar NP.  Unclear why patient was not discharged yesterday afternoon.  Patient remains medically cleared and now that he is psych cleared he will be discharged from the emergency department.  Patient informed that sutures will need to be removed and 2 days either at his PCP, and urgent care or here at the emergency department.  At this time there does not appear to be any evidence of an acute emergency medical condition and the patient appears stable for discharge with appropriate outpatient follow up. Diagnosis was discussed with patient who verbalizes understanding of care plan and is agreeable to discharge. I have discussed return precautions with patient who verbalizes understanding. Patient encouraged to follow-up with their PCP and psychiatry resources. All questions answered.  Patient's case discussed with Dr. Tomi Bamberger who agrees with plan to discharge and is rescinding the IVC.  Note: Portions of this report may have been transcribed using voice recognition software. Every effort was made to ensure accuracy; however, inadvertent computerized transcription errors may still be present.   Gari Crown 08/06/20 1105    Dorie Rank, MD 08/07/20 [phone]

## 2020-11-08 ENCOUNTER — Encounter: Payer: Self-pay | Admitting: Physician Assistant

## 2020-11-08 ENCOUNTER — Ambulatory Visit (INDEPENDENT_AMBULATORY_CARE_PROVIDER_SITE_OTHER): Payer: Medicare Other | Admitting: Physician Assistant

## 2020-11-08 VITALS — BP 124/70 | HR 85 | Ht 71.0 in | Wt 223.0 lb

## 2020-11-08 DIAGNOSIS — L989 Disorder of the skin and subcutaneous tissue, unspecified: Secondary | ICD-10-CM | POA: Diagnosis not present

## 2020-11-08 NOTE — Progress Notes (Signed)
Chief Complaint: "Painful bump near my rectum"  HPI:    Jordan Gray is a 40 year old male, known to Dr. Bryan Lemma, with a past medical history as listed below including diabetes and mental retardation as well as schizoaffective disorder, who was referred to me by Selina Cooley, NP, for a complaint of "painful bump near my rectum".      06/01/2020 colonoscopy done for surveillance of a personal history of colon polyps on his last colonoscopy at outside facility 6 years prior.  Findings of a perianal rash, one 2 mm polyp in the ascending colon, 5 3-5 mm polyps in the sigmoid colon, one 2 mm polyp in the rectum and nonbleeding internal hemorrhoids.  Pathology showed tubular adenomas.  At that time recommended repeat colonoscopy in 3 years.  He was told to use fiber.  It was discussed that internal hemorrhoids may be amenable to hemorrhoid band ligation.    Today, the patient presents to clinic accompanied by his caregiver from his group home.  Together they explain that since September when he had his colonoscopy he developed a "bump", down by his rectum which is very painful to touch.  Patient tells me this does not change when passing a stool it is just "always there".  Rated as a 5-6/10.  Tells me he saw his PCP who "tried to remove it in her office", but this was unsuccessful.    Denies fever, chills, blood in his stool or weight loss.  Past Medical History:  Diagnosis Date   Diabetes mellitus without complication ([place])    MR (mental retardation)    Schizoaffective disorder ([place])     Past Surgical History:  Procedure Laterality Date   APPENDECTOMY     HIP SURGERY Left     Current Outpatient Medications  Medication Sig Dispense Refill   acetaminophen (TYLENOL) 325 MG tablet Take 650 mg by mouth every 6 (six) hours as needed for headache.     benztropine (COGENTIN) 1 MG tablet Take 1 mg by mouth 2 (two) times daily.     clonazePAM (KLONOPIN) 1 MG tablet Take 1 mg by mouth in the  morning, at noon, and at bedtime.     divalproex (DEPAKOTE) 250 MG DR tablet Take 1 tablet (250 mg total) by mouth in the morning. 30 tablet 0   divalproex (DEPAKOTE) 500 MG DR tablet Take 500 mg by mouth at bedtime.     docusate sodium (COLACE) 100 MG capsule Take 100 mg by mouth daily.     FLUoxetine HCl 60 MG TABS Take 60 mg by mouth daily. 30 tablet 0   haloperidol (HALDOL) 5 MG tablet Take 5 mg by mouth 2 (two) times daily.     hydrocortisone (ANUSOL-HC) 2.5 % rectal cream Place 1 application rectally 2 (two) times daily. (Patient not taking: Reported on 07/30/2020) 30 g 1   hydrocortisone (ANUSOL-HC) 25 MG suppository Place 1 suppository (25 mg total) rectally at bedtime. (Patient not taking: Reported on 07/30/2020) 12 suppository 3   lisinopril (ZESTRIL) 5 MG tablet Take 5 mg by mouth daily.     propranolol (INDERAL) 10 MG tablet Take 10 mg by mouth 3 (three) times daily.     No current facility-administered medications for this visit.    Allergies as of 11/08/2020 - Review Complete 07/30/2020  Allergen Reaction Noted   Bactrim [sulfamethoxazole-trimethoprim] Hives 05/11/2016   Bee venom Swelling 07/30/2020   Latex Hives 05/11/2016   Other  05/11/2016    Family History  Problem Relation  Age of Onset   Throat cancer Mother    Esophageal cancer Mother    Diabetes Father    Colon cancer Neg Hx    Rectal cancer Neg Hx    Stomach cancer Neg Hx     Social History   Socioeconomic History   Marital status: Single    Spouse name: Not on file   Number of children: Not on file   Years of education: Not on file   Highest education level: Not on file  Occupational History   Not on file  Tobacco Use   Smoking status: Current Every Day Smoker    Packs/day: 0.25    Types: Cigarettes   Smokeless tobacco: Never Used  Vaping Use   Vaping Use: Never used  Substance and Sexual Activity   Alcohol use: No   Drug use: No   Sexual activity: Not on file   Other Topics Concern   Not on file  Social History Narrative   Not on file   Social Determinants of Health   Financial Resource Strain: Not on file  Food Insecurity: Not on file  Transportation Needs: Not on file  Physical Activity: Not on file  Stress: Not on file  Social Connections: Not on file  Intimate Partner Violence: Not on file    Review of Systems:    Constitutional: No weight loss, fever or chills Cardiovascular: No chest pain  Respiratory: No SOB  Gastrointestinal: See HPI and otherwise negative   Physical Exam:  Vital signs: BP 124/70    Pulse 85    Ht 5\' 11"  (1.803 m)    Wt 223 lb (101.2 kg)    BMI 31.10 kg/m   Constitutional:   Pleasant Caucasian male appears to be in NAD, Well developed, Well nourished, alert and cooperative Respiratory: Respirations even and unlabored. Lungs clear to auscultation bilaterally.   No wheezes, crackles, or rhonchi.  Cardiovascular: Normal S1, S2. No MRG. Regular rate and rhythm. No peripheral edema, cyanosis or pallor.  Gastrointestinal:  Soft, nondistended, nontender. No rebound or guarding. Normal bowel sounds. No appreciable masses or hepatomegaly. Rectal:  External: 1.5 x1.5 cm wart like raised skin lesion at least 3 inches inferior to rectal opening, ttp  Psychiatric: Demonstrates good judgement and reason without abnormal affect or behaviors.  Assessment: 1.  Wartlike lesion near her rectum: There is a painful wartlike raised lesion near patient's rectum  Plan: 1.  Referred patient to the dermatologist. 2.  Patient to follow in clinic as needed with [place] in the future.  Jordan Newer, PA-C [facility] 11/08/2020, 10:53 AM  Cc: [facility] *

## 2020-11-08 NOTE — Patient Instructions (Signed)
If you are age 39 or older, your body mass index should be between 23-30. Your Body mass index is 31.1 kg/m. If this is out of the aforementioned range listed, please consider follow up with your Primary Care Provider.  If you are age 26 or younger, your body mass index should be between 19-25. Your Body mass index is 31.1 kg/m. If this is out of the aformentioned range listed, please consider follow up with your Primary Care Provider.   We have sent a Referral to Dermatology they will call to make an appointment.  Thank you for choosing me and [facility].  Ellouise Newer, PA-C

## 2020-11-09 NOTE — Progress Notes (Signed)
Agree with the assessment and plan as outlined by Jennifer Lemmon, PA-C. ? ?Sahith Nurse, DO, FACG ? ?

## 2020-11-21 ENCOUNTER — Telehealth: Payer: Self-pay

## 2020-11-21 NOTE — Telephone Encounter (Signed)
Left voicemail for patients guardian to give me a call back about patients referral to dermatology.

## 2020-11-25 NOTE — Telephone Encounter (Signed)
Faxed referral to Dermatologist specialist at [phone].

## 2020-11-29 ENCOUNTER — Ambulatory Visit: Payer: Medicare Other | Admitting: Gastroenterology

## 2021-04-21 ENCOUNTER — Other Ambulatory Visit: Payer: Self-pay

## 2021-04-21 ENCOUNTER — Emergency Department (HOSPITAL_COMMUNITY)
Admission: EM | Admit: 2021-04-21 | Discharge: 2021-04-22 | Disposition: A | Discharge status: Home / Self Care | Payer: Medicare Other | Attending: Emergency Medicine | Admitting: Emergency Medicine

## 2021-04-21 ENCOUNTER — Encounter (HOSPITAL_COMMUNITY): Payer: Self-pay | Admitting: Emergency Medicine

## 2021-04-21 ENCOUNTER — Emergency Department (HOSPITAL_COMMUNITY): Payer: Medicare Other

## 2021-04-21 DIAGNOSIS — F79 Unspecified intellectual disabilities: Secondary | ICD-10-CM

## 2021-04-21 DIAGNOSIS — F251 Schizoaffective disorder, depressive type: Secondary | ICD-10-CM | POA: Diagnosis not present

## 2021-04-21 DIAGNOSIS — S59911A Unspecified injury of right forearm, initial encounter: Secondary | ICD-10-CM | POA: Diagnosis present

## 2021-04-21 DIAGNOSIS — S51811A Laceration without foreign body of right forearm, initial encounter: Secondary | ICD-10-CM

## 2021-04-21 DIAGNOSIS — E119 Type 2 diabetes mellitus without complications: Secondary | ICD-10-CM | POA: Diagnosis not present

## 2021-04-21 DIAGNOSIS — F259 Schizoaffective disorder, unspecified: Secondary | ICD-10-CM | POA: Diagnosis present

## 2021-04-21 DIAGNOSIS — Z79899 Other long term (current) drug therapy: Secondary | ICD-10-CM | POA: Diagnosis not present

## 2021-04-21 DIAGNOSIS — Z23 Encounter for immunization: Secondary | ICD-10-CM | POA: Diagnosis not present

## 2021-04-21 DIAGNOSIS — Z91040 Latex allergy status: Secondary | ICD-10-CM | POA: Diagnosis not present

## 2021-04-21 DIAGNOSIS — Y900 Blood alcohol level of less than 20 mg/100 ml: Secondary | ICD-10-CM | POA: Diagnosis not present

## 2021-04-21 DIAGNOSIS — I10 Essential (primary) hypertension: Secondary | ICD-10-CM | POA: Diagnosis not present

## 2021-04-21 DIAGNOSIS — R45851 Suicidal ideations: Secondary | ICD-10-CM

## 2021-04-21 DIAGNOSIS — X788XXA Intentional self-harm by other sharp object, initial encounter: Secondary | ICD-10-CM | POA: Insufficient documentation

## 2021-04-21 DIAGNOSIS — F4325 Adjustment disorder with mixed disturbance of emotions and conduct: Secondary | ICD-10-CM | POA: Diagnosis present

## 2021-04-21 DIAGNOSIS — F17210 Nicotine dependence, cigarettes, uncomplicated: Secondary | ICD-10-CM | POA: Diagnosis not present

## 2021-04-21 LAB — CBC
HCT: 45.2 % (ref 39.0–52.0)
Hemoglobin: 16 g/dL (ref 13.0–17.0)
MCH: 30.8 pg (ref 26.0–34.0)
MCHC: 35.4 g/dL (ref 30.0–36.0)
MCV: 86.9 fL (ref 80.0–100.0)
Platelets: 230 10*3/uL (ref 150–400)
RBC: 5.2 MIL/uL (ref 4.22–5.81)
RDW: 12.9 % (ref 11.5–15.5)
WBC: 7.9 10*3/uL (ref 4.0–10.5)
nRBC: 0 % (ref 0.0–0.2)

## 2021-04-21 LAB — COMPREHENSIVE METABOLIC PANEL
ALT: 28 U/L (ref 0–44)
AST: 20 U/L (ref 15–41)
Albumin: 3.8 g/dL (ref 3.5–5.0)
Alkaline Phosphatase: 80 U/L (ref 38–126)
Anion gap: 8 (ref 5–15)
BUN: 15 mg/dL (ref 6–20)
CO2: 26 mmol/L (ref 22–32)
Calcium: 9.4 mg/dL (ref 8.9–10.3)
Chloride: 104 mmol/L (ref 98–111)
Creatinine, Ser: 0.82 mg/dL (ref 0.61–1.24)
GFR, Estimated: >60 mL/min (ref >60)
Glucose, Bld: 94 mg/dL (ref 70–99)
Potassium: 3.9 mmol/L (ref 3.5–5.1)
Sodium: 138 mmol/L (ref 135–145)
Total Bilirubin: 0.5 mg/dL (ref 0.3–1.2)
Total Protein: 7 g/dL (ref 6.5–8.1)

## 2021-04-21 LAB — SALICYLATE LEVEL: Salicylate Lvl: <7 mg/dL — ABNORMAL LOW (ref 7.0–30.0)

## 2021-04-21 LAB — ETHANOL: Alcohol, Ethyl (B): <10 mg/dL (ref <10)

## 2021-04-21 LAB — ACETAMINOPHEN LEVEL: Acetaminophen (Tylenol), Serum: <10 ug/mL — ABNORMAL LOW (ref 10–30)

## 2021-04-21 MED ORDER — HALOPERIDOL 5 MG PO TABS
5.0000 mg | ORAL_TABLET | Freq: Two times a day (BID) | ORAL | Status: DC
  Administered 2021-04-22: 5 mg via ORAL
  Filled 2021-04-21: qty 1

## 2021-04-21 MED ORDER — NICOTINE POLACRILEX 2 MG MT GUM
4.0000 mg | CHEWING_GUM | OROMUCOSAL | Status: DC | PRN
  Administered 2021-04-22 (×3): 4 mg via ORAL
  Filled 2021-04-21 (×3): qty 2

## 2021-04-21 MED ORDER — LIDOCAINE-EPINEPHRINE (PF) 2 %-1:200000 IJ SOLN
10.0000 mL | Freq: Once | INTRAMUSCULAR | Status: DC
  Filled 2021-04-21: qty 20

## 2021-04-21 MED ORDER — HALOPERIDOL 5 MG PO TABS
10.0000 mg | ORAL_TABLET | Freq: Every day | ORAL | Status: DC
  Administered 2021-04-21: 10 mg via ORAL
  Filled 2021-04-21: qty 2

## 2021-04-21 MED ORDER — ACETAMINOPHEN 500 MG PO TABS
1000.0000 mg | ORAL_TABLET | Freq: Once | ORAL | Status: AC
  Administered 2021-04-21: 1000 mg via ORAL
  Filled 2021-04-21: qty 2

## 2021-04-21 MED ORDER — PROPRANOLOL HCL 20 MG PO TABS
10.0000 mg | ORAL_TABLET | Freq: Three times a day (TID) | ORAL | Status: DC
  Administered 2021-04-21 – 2021-04-22 (×2): 10 mg via ORAL
  Filled 2021-04-21 (×2): qty 1

## 2021-04-21 MED ORDER — DIVALPROEX SODIUM 500 MG PO DR TAB
500.0000 mg | DELAYED_RELEASE_TABLET | Freq: Two times a day (BID) | ORAL | Status: DC
  Administered 2021-04-21 – 2021-04-22 (×2): 500 mg via ORAL
  Filled 2021-04-21 (×2): qty 1

## 2021-04-21 MED ORDER — CLONAZEPAM 1 MG PO TABS
1.0000 mg | ORAL_TABLET | Freq: Every day | ORAL | Status: DC
  Administered 2021-04-22: 1 mg via ORAL
  Filled 2021-04-21: qty 1

## 2021-04-21 MED ORDER — LISINOPRIL 10 MG PO TABS
5.0000 mg | ORAL_TABLET | Freq: Every day | ORAL | Status: DC
  Administered 2021-04-22: 5 mg via ORAL
  Filled 2021-04-21: qty 1

## 2021-04-21 MED ORDER — HYDROXYZINE HCL 25 MG PO TABS: 50.0000 mg | ORAL_TABLET | Freq: Every day | ORAL | Status: DC | PRN

## 2021-04-21 MED ORDER — FLUOXETINE HCL 20 MG PO CAPS
40.0000 mg | ORAL_CAPSULE | Freq: Every day | ORAL | Status: DC
  Administered 2021-04-22: 40 mg via ORAL
  Filled 2021-04-21: qty 2

## 2021-04-21 MED ORDER — DOCUSATE SODIUM 100 MG PO CAPS
100.0000 mg | ORAL_CAPSULE | Freq: Every day | ORAL | Status: DC
  Administered 2021-04-22: 100 mg via ORAL
  Filled 2021-04-21: qty 1

## 2021-04-21 MED ORDER — BENZTROPINE MESYLATE 1 MG PO TABS
1.0000 mg | ORAL_TABLET | Freq: Two times a day (BID) | ORAL | Status: DC
  Administered 2021-04-21 – 2021-04-22 (×2): 1 mg via ORAL
  Filled 2021-04-21 (×2): qty 1

## 2021-04-21 MED ORDER — TETANUS-DIPHTH-ACELL PERTUSSIS 5-2.5-18.5 LF-MCG/0.5 IM SUSY
0.5000 mL | PREFILLED_SYRINGE | Freq: Once | INTRAMUSCULAR | Status: AC
  Administered 2021-04-21: 0.5 mL via INTRAMUSCULAR
  Filled 2021-04-21: qty 0.5

## 2021-04-21 MED ORDER — MELATONIN 5 MG PO TABS
5.0000 mg | ORAL_TABLET | Freq: Every day | ORAL | Status: DC
  Administered 2021-04-21: 5 mg via ORAL
  Filled 2021-04-21: qty 1

## 2021-04-21 NOTE — ED Provider Notes (Signed)
Emergency Medicine Provider Triage Evaluation Note  Jordan Gray , a 40 y.o. male  was evaluated in triage.  Pt complains of SI.  Review of Systems  Positive: SI, laceration R forearm Negative: HI, AVH, drug abuse  Physical Exam  BP (!) 122/92 (BP Location: Left Arm)   Pulse 85   Temp 98.7 F (37.1 C) (Oral)   Resp 16   Ht '5\' 11"'$  (1.803 m)   Wt 101.2 kg   SpO2 96%   BMI 31.10 kg/m  Gen:   Awake, no distress   Resp:  Normal effort  MSK:   Moves extremities without difficulty  Other:  Laceration to R forearm with dressing in place  Medical Decision Making  Medically screening exam initiated at 8:09 PM.  Appropriate orders placed.  Jordan Gray was informed that the remainder of the evaluation will be completed by another provider, this initial triage assessment does not replace that evaluation, and the importance of remaining in the ED until their evaluation is complete.  Patient accompanied by a staff member to the ER for evaluation of suicidal ideation.  Patient also broke a coffee mug and cut himself to his right forearm prior to arrival.  States that he did it for attention.  Denies homicidal ideation auditory or visual hallucination.  Will need tetanus   Domenic Moras, PA-C 04/21/21 2018    Charlesetta Shanks, MD 04/22/21 [phone]

## 2021-04-21 NOTE — ED Provider Notes (Signed)
[facility] DEPT Provider Note   CSN: [id number] Arrival date & time: 04/21/21  1858     History Chief Complaint  Patient presents with   Suicidal   Laceration    Jordan Gray is a 40 y.o. male with PMHx MR, diabetes, and schizoaffective disorder who presents to the ED today with complaint of suicidal ideation and laceration to right forearm.  He reports he used a broken coffee mug to sustained a laceration to his forearm today.  He states he did it in a attempt at self-harm.  He is unsure regarding tetanus status.  States he is still feeling suicidal at this time and would like to speak with behavioral health.  Denies any homicidal ideation or AVH.   The history is provided by the patient and medical records.      Past Medical History:  Diagnosis Date   Diabetes mellitus without complication ([place])    MR (mental retardation)    Schizoaffective disorder ([employer])     Patient Active Problem List   Diagnosis Date Noted   Schizoaffective disorder ([place]) 05/17/2020   Diabetes ([place]) 05/17/2020   HTN (hypertension) 05/17/2020   Intellectual disability 05/17/2020    Past Surgical History:  Procedure Laterality Date   APPENDECTOMY     HIP SURGERY Left 2007       Family History  Problem Relation Age of Onset   Throat cancer Mother    Esophageal cancer Mother    Diabetes Father    Colon cancer Neg Hx    Rectal cancer Neg Hx    Stomach cancer Neg Hx     Social History   Tobacco Use   Smoking status: Every Day    Packs/day: 0.25    Types: Cigarettes   Smokeless tobacco: Never  Vaping Use   Vaping Use: Never used  Substance Use Topics   Alcohol use: No   Drug use: No    Home Medications Prior to Admission medications   Medication Sig Start Date End Date Taking? Authorizing Provider  acetaminophen (TYLENOL) 325 MG tablet Take 650 mg by mouth every 6 (six) hours as needed for headache.    [provider]  benztropine  (COGENTIN) 1 MG tablet Take 1 mg by mouth 2 (two) times daily.    [provider]  cetirizine (ZYRTEC) 10 MG tablet Take 10 mg by mouth daily.    [provider]  clonazePAM (KLONOPIN) 2 MG tablet Take 1 mg by mouth daily. 11/07/20   [provider]  divalproex (DEPAKOTE) 500 MG DR tablet Take 500 mg by mouth 2 (two) times daily.    [provider]  docusate sodium (COLACE) 100 MG capsule Take 100 mg by mouth daily.    [provider]  FLUoxetine (PROZAC) 40 MG capsule Take 40 mg by mouth daily. 11/03/20   [provider]  fluticasone (FLONASE) 50 MCG/ACT nasal spray Place 1 spray into both nostrils daily.    [provider]  haloperidol (HALDOL) 10 MG tablet Take 10 mg by mouth at bedtime. 11/03/20   [provider]  haloperidol (HALDOL) 5 MG tablet Take 5 mg by mouth 2 (two) times daily.    [provider]  hydrOXYzine (ATARAX/VISTARIL) 50 MG tablet Take 50 mg by mouth daily as needed.    [provider]  lisinopril (ZESTRIL) 5 MG tablet Take 5 mg by mouth daily.    [provider]  melatonin 5 MG TABS Take 5 mg by  mouth at bedtime.    [provider]  nystatin (NYSTATIN) powder Apply 1 application topically 3 (three) times daily.    [provider]  nystatin-triamcinolone (MYCOLOG II) cream Apply 1 application topically 2 (two) times daily.    [provider]  polyethylene glycol (MIRALAX / GLYCOLAX) 17 g packet Take 17 g by mouth daily.    [provider]  propranolol (INDERAL) 10 MG tablet Take 10 mg by mouth 3 (three) times daily.    [provider]    Allergies    Bactrim [sulfamethoxazole-trimethoprim], Bee venom, Latex, and Other  Review of Systems   Review of Systems  Constitutional:  Negative for chills and fever.  Skin:  Positive for wound.  Psychiatric/Behavioral:  Positive for self-injury and suicidal ideas.   All other systems reviewed  and are negative.  Physical Exam Updated Vital Signs BP (!) 127/93 (BP Location: Left Arm)   Pulse 76   Temp 98.7 F (37.1 C) (Oral)   Resp 15   Ht '5\' 11"'$  (1.803 m)   Wt 101.2 kg   SpO2 98%   BMI 31.10 kg/m   Physical Exam Vitals and nursing note reviewed.  Constitutional:      Appearance: He is not ill-appearing or diaphoretic.  HENT:     Head: Normocephalic and atraumatic.  Eyes:     Conjunctiva/sclera: Conjunctivae normal.  Cardiovascular:     Rate and Rhythm: Normal rate and regular rhythm.  Pulmonary:     Effort: Pulmonary effort is normal.     Breath sounds: Normal breath sounds. No wheezing, rhonchi or rales.  Abdominal:     Palpations: Abdomen is soft.     Tenderness: There is no abdominal tenderness.  Musculoskeletal:     Cervical back: Neck supple.     Comments: 10 centimeter laceration to the mid right forearm along volar aspect, bleeding controlled.  2+ radial pulse.   Skin:    General: Skin is warm and dry.  Neurological:     Mental Status: He is alert.    ED Results / Procedures / Treatments   Labs (all labs ordered are listed, but only abnormal results are displayed) Labs Reviewed  SALICYLATE LEVEL - Abnormal; Notable for the following components:      Result Value   Salicylate Lvl [id number] (*)    All other components within normal limits  ACETAMINOPHEN LEVEL - Abnormal; Notable for the following components:   Acetaminophen (Tylenol), Serum <10 (*)    All other components within normal limits  COMPREHENSIVE METABOLIC PANEL  ETHANOL  CBC  RAPID URINE DRUG SCREEN, HOSP PERFORMED    EKG None  Radiology No results found.  Procedures .Marland KitchenLaceration Repair  Date/Time: 04/21/2021 10:50 PM Performed by: Eustaquio Maize, PA-C Authorized by: Eustaquio Maize, PA-C   Consent:    Consent obtained:  Verbal   Consent given by:  Patient   Risks discussed:  Infection, pain and poor cosmetic result Universal protocol:    Patient identity confirmed:   Verbally with patient Laceration details:    Location:  Shoulder/arm   Shoulder/arm location:  R lower arm   Length (cm):  10   Depth (mm):  2 Pre-procedure details:    Preparation:  Patient was prepped and draped in usual sterile fashion Treatment:    Area cleansed with:  Povidone-iodine   Irrigation solution:  Sterile saline Skin repair:    Repair method:  Sutures   Suture size:  4-0   Suture material:  Prolene  Suture technique:  Simple interrupted   Number of sutures:  12 Approximation:    Approximation:  Close Repair type:    Repair type:  Simple Post-procedure details:    Dressing:  Non-adherent dressing   Procedure completion:  Tolerated well, no immediate complications   Medications Ordered in ED Medications  Tdap (BOOSTRIX) injection 0.5 mL (has no administration in time range)    ED Course  I have reviewed the triage vital signs and the nursing notes.  Pertinent labs & imaging results that were available during my care of the patient were reviewed by me and considered in my medical decision making (see chart for details).    MDM Rules/Calculators/A&P                           40 year old male who presents to the ED today for suicidal ideation and laceration to right forearm, self-inflicted earlier today.  On arrival to the ED vitals are stable.  Lab work was obtained for medical screening.  Lab work unremarkable at this time.  Patient will require sutures for his 10 cm laceration to the volar aspect of his right forearm.  We will update tetanus at this time and provide pain medication with plans for suture and TTS consult.  He is here voluntarily.  Sutures placed. Pending TTS eval.   Final Clinical Impression(s) / ED Diagnoses Final diagnoses:  None    Rx / DC Orders ED Discharge Orders     None        Eustaquio Maize, PA-C 04/21/21 2250    Regan Lemming, MD 04/22/21 [phone]

## 2021-04-21 NOTE — ED Notes (Signed)
TTS completed. 

## 2021-04-21 NOTE — ED Notes (Addendum)
[name], caregiver, can be contacted at [phone].

## 2021-04-21 NOTE — ED Triage Notes (Signed)
Patient arrives via EMS from an [facility] accompanied by a staff member. Facility called after patient got upset and broke a coffee mug and cut himself, and per EMS, patient created a 5 in laceration, bleeding controlled. Patient stated to EMS that he was suicidal and was attempting to hurt himself. Patient has hx of same.

## 2021-04-21 NOTE — BH Assessment (Signed)
Clinician sent a secure internal message to pt's providers at 2224 requesting the machine be moved to pt's room to complete his MH Assessment. Clinician also called Triage at 2216 and 2218 but there was no answer. Awaiting response from a member of pt's team at this time.

## 2021-04-21 NOTE — ED Notes (Signed)
Patient wanded in Triage.  Belongings placed in locker 30.
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
## 2021-04-22 DIAGNOSIS — F4325 Adjustment disorder with mixed disturbance of emotions and conduct: Secondary | ICD-10-CM | POA: Diagnosis present

## 2021-04-22 DIAGNOSIS — S51811A Laceration without foreign body of right forearm, initial encounter: Secondary | ICD-10-CM | POA: Diagnosis not present

## 2021-04-22 DIAGNOSIS — F259 Schizoaffective disorder, unspecified: Secondary | ICD-10-CM | POA: Diagnosis not present

## 2021-04-22 MED ORDER — HYDROCODONE-ACETAMINOPHEN 5-325 MG PO TABS
1.0000 | ORAL_TABLET | Freq: Four times a day (QID) | ORAL | Status: DC | PRN
  Administered 2021-04-22: 1 via ORAL
  Filled 2021-04-22: qty 1

## 2021-04-22 MED ORDER — BACITRACIN ZINC 500 UNIT/GM EX OINT
TOPICAL_OINTMENT | CUTANEOUS | Status: AC
  Filled 2021-04-22: qty 0.9

## 2021-04-22 MED ORDER — ACETAMINOPHEN 325 MG PO TABS
650.0000 mg | ORAL_TABLET | ORAL | Status: DC | PRN
Start: 2021-04-22 — End: 2021-04-22
  Administered 2021-04-22: 650 mg via ORAL
  Filled 2021-04-22: qty 2

## 2021-04-22 NOTE — Consult Note (Signed)
[place] Psychiatry Consult   Reason for Consult:  SI Referring Physician:  Drue Second PA-C Patient Identification: Jordan Gray MRN:  [id number] Principal Diagnosis: Adjustment disorder with mixed disturbance of emotions and conduct Diagnosis:  Principal Problem:   Adjustment disorder with mixed disturbance of emotions and conduct Active Problems:   Schizoaffective disorder ([place])   Intellectual disability   Total Time spent with patient: 20 minutes  Subjective:   Jordan Gray is a 40 y.o. male patient admitted with suicidal ideations after an incident at his group home where he broke coffee mug and cut himself. Patient known to [facility] health system has history of intellectual disability and lives at an [facility] facility in [place].   On assessment patient reports his "mother died around this time in 2003-12-27". States he's "been sad" and "got mad". Patient presents alert and oriented, calm and cooperative; concrete thought process. He reports history of chronic suicidal ideations, auditory hallucinations, and impulsive behaviors. He denies any active suicidal or homicidal ideations, auditory or visual hallucinations, and does not appear to be responding to any external/internal stimuli at this time. Provider discussed at length utilizing coping skills as a release when frustrated at the group home. Patient states he goes on visits with his sister "some weekends and it's helpful". Patient states he has a punching bag at the facility and will working using it. Social work to contact [facility] to arrange transportation.   HPI:   Jordan Gray is a 40 year old male who presented to [facility] voluntarily from [place] for reports of suicidal ideations after breaking a coffee mug and cutting his arm. Per EDP note patient reported doing it "for attention". Patient has a past history of IDD, schizoaffective disorder. Patient has legal guardian [name] via [place] [employer].   Past Psychiatric  History:   -IDD  -Schizoaffective disorder  Risk to Self:   Risk to Others:   Prior Inpatient Therapy:   Prior Outpatient Therapy:    Past Medical History:  Past Medical History:  Diagnosis Date   Diabetes mellitus without complication ([place])    MR (mental retardation)    Schizoaffective disorder ([place])     Past Surgical History:  Procedure Laterality Date   APPENDECTOMY     HIP SURGERY Left 12-26-2005   Family History:  Family History  Problem Relation Age of Onset   Throat cancer Mother    Esophageal cancer Mother    Diabetes Father    Colon cancer Neg Hx    Rectal cancer Neg Hx    Stomach cancer Neg Hx    Family Psychiatric  History: not noted Social History:  Social History   Substance and Sexual Activity  Alcohol Use No     Social History   Substance and Sexual Activity  Drug Use No    Social History   Socioeconomic History   Marital status: Single    Spouse name: Not on file   Number of children: Not on file   Years of education: Not on file   Highest education level: Not on file  Occupational History   Not on file  Tobacco Use   Smoking status: Every Day    Packs/day: 0.25    Types: Cigarettes   Smokeless tobacco: Never  Vaping Use   Vaping Use: Never used  Substance and Sexual Activity   Alcohol use: No   Drug use: No   Sexual activity: Not on file  Other Topics Concern   Not  on file  Social History Narrative   Not on file   Social Determinants of Health   Financial Resource Strain: Not on file  Food Insecurity: Not on file  Transportation Needs: Not on file  Physical Activity: Not on file  Stress: Not on file  Social Connections: Not on file   Additional Social History:    Allergies:   Allergies  Allergen Reactions   Bactrim [Sulfamethoxazole-Trimethoprim] Hives   Bee Venom Swelling   Latex Hives   Other     Nicotine patch    Labs:  Results for orders placed or performed during the hospital encounter of 04/21/21 (from the  past 48 hour(s))  Comprehensive metabolic panel     Status: None   Collection Time: 04/21/21  8:05 PM  Result Value Ref Range   Sodium 138 135 - 145 mmol/L   Potassium 3.9 3.5 - 5.1 mmol/L   Chloride 104 98 - 111 mmol/L   CO2 26 22 - 32 mmol/L   Glucose, Bld 94 70 - 99 mg/dL    Comment: Glucose reference range applies only to samples taken after fasting for at least 8 hours.   BUN 15 6 - 20 mg/dL   Creatinine, Ser 0.82 0.61 - 1.24 mg/dL   Calcium 9.4 8.9 - 10.3 mg/dL   Total Protein 7.0 6.5 - 8.1 g/dL   Albumin 3.8 3.5 - 5.0 g/dL   AST 20 15 - 41 U/L   ALT 28 0 - 44 U/L   Alkaline Phosphatase 80 38 - 126 U/L   Total Bilirubin 0.5 0.3 - 1.2 mg/dL   GFR, Estimated >60 >60 mL/min    Comment: (NOTE) Calculated using the CKD-EPI Creatinine Equation (2021)    Anion gap 8 5 - 15    Comment: Performed at [facility], [place] [address] [place], [place] [address]  Ethanol     Status: None   Collection Time: 04/21/21  8:05 PM  Result Value Ref Range   Alcohol, Ethyl (B) <10 <10 mg/dL    Comment: (NOTE) Lowest detectable limit for serum alcohol is 10 mg/dL.  For medical purposes only. Performed at [facility], [place] [address] [place], [place] [id number]   Salicylate level     Status: Abnormal   Collection Time: 04/21/21  8:05 PM  Result Value Ref Range   Salicylate Lvl [id number] (L) 7.0 - 30.0 mg/dL    Comment: Performed at [facility], [place] [address], [place], [place] [address]  Acetaminophen level     Status: Abnormal   Collection Time: 04/21/21  8:05 PM  Result Value Ref Range   Acetaminophen (Tylenol), Serum <10 (L) 10 - 30 ug/mL    Comment: (NOTE) Therapeutic concentrations vary significantly. A range of 10-30 ug/mL  may be an effective concentration for many patients. However, some  are best treated at concentrations outside of this range. Acetaminophen concentrations >150 ug/mL at 4 hours after ingestion  and >50 ug/mL  at 12 hours after ingestion are often associated with  toxic reactions.  Performed at [facility], [place] [address], [place], [place] [address]   cbc     Status: None   Collection Time: 04/21/21  8:05 PM  Result Value Ref Range   WBC 7.9 4.0 - 10.5 K/uL   RBC 5.20 4.22 - 5.81 MIL/uL   Hemoglobin 16.0 13.0 - 17.0 g/dL   HCT 45.2 39.0 - 52.0 %   MCV 86.9 80.0 - 100.0 fL  MCH 30.8 26.0 - 34.0 pg   MCHC 35.4 30.0 - 36.0 g/dL   RDW 12.9 11.5 - 15.5 %   Platelets 230 150 - 400 K/uL   nRBC 0.0 0.0 - 0.2 %    Comment: Performed at [facility], [place] [address] [place], [place] [address]    Current Facility-Administered Medications  Medication Dose Route Frequency Provider Last Rate Last Admin   acetaminophen (TYLENOL) tablet 650 mg  650 mg Oral [id number] PRN Delora Fuel, MD   [id number] mg at 04/22/21 [id number]   benztropine (COGENTIN) tablet 1 mg  1 mg Oral BID Alroy Bailiff, Margaux, PA-C   1 mg at 04/22/21 0941   clonazePAM (KLONOPIN) tablet 1 mg  1 mg Oral Daily Alroy Bailiff, Margaux, PA-C   1 mg at 04/22/21 0941   divalproex (DEPAKOTE) DR tablet 500 mg  500 mg Oral BID Alroy Bailiff, Margaux, PA-C   500 mg at 04/22/21 [id number]   docusate sodium (COLACE) capsule 100 mg  100 mg Oral Daily Eustaquio Maize, PA-C   100 mg at 04/22/21 0941   FLUoxetine (PROZAC) capsule 40 mg  40 mg Oral Daily Eustaquio Maize, PA-C   40 mg at 04/22/21 0941   haloperidol (HALDOL) tablet 10 mg  10 mg Oral QHS Venter, Margaux, PA-C   10 mg at 04/21/21 2354   haloperidol (HALDOL) tablet 5 mg  5 mg Oral BID Eustaquio Maize, PA-C   5 mg at 04/22/21 0940   HYDROcodone-acetaminophen (NORCO/VICODIN) 5-325 MG per tablet 1 tablet  1 tablet Oral Q6H PRN Daleen Bo, MD   1 tablet at 04/22/21 0844   hydrOXYzine (ATARAX/VISTARIL) tablet 50 mg  50 mg Oral Daily PRN Alroy Bailiff, Margaux, PA-C       lidocaine-EPINEPHrine (XYLOCAINE W/EPI) 2 %-1:200000 (PF) injection 10 mL  10 mL Infiltration Once Venter, Margaux, PA-C        lisinopril (ZESTRIL) tablet 5 mg  5 mg Oral Daily Alroy Bailiff, Margaux, PA-C   5 mg at 04/22/21 [id number]   melatonin tablet 5 mg  5 mg Oral QHS Alroy Bailiff, Margaux, PA-C   5 mg at 04/21/21 2354   nicotine polacrilex (NICORETTE) gum 4 mg  4 mg Oral PRN Regan Lemming, MD   4 mg at 04/22/21 0845   propranolol (INDERAL) tablet 10 mg  10 mg Oral TID Eustaquio Maize, PA-C   10 mg at 04/22/21 [id number]   Current Outpatient Medications  Medication Sig Dispense Refill   acetaminophen (TYLENOL) 325 MG tablet Take 650 mg by mouth every 6 (six) hours as needed for headache.     benztropine (COGENTIN) 1 MG tablet Take 1 mg by mouth 2 (two) times daily.     cetirizine (ZYRTEC) 10 MG tablet Take 10 mg by mouth daily.     clonazePAM (KLONOPIN) 2 MG tablet Take 1 mg by mouth daily.     divalproex (DEPAKOTE) 500 MG DR tablet Take 500 mg by mouth 2 (two) times daily.     docusate sodium (COLACE) 100 MG capsule Take 100 mg by mouth daily.     FLUoxetine (PROZAC) 40 MG capsule Take 40 mg by mouth daily.     fluticasone (FLONASE) 50 MCG/ACT nasal spray Place 1 spray into both nostrils daily.     haloperidol (HALDOL) 10 MG tablet Take 10 mg by mouth at bedtime.     haloperidol (HALDOL) 5 MG tablet Take 5 mg by mouth 2 (two) times daily.     hydrOXYzine (ATARAX/VISTARIL) 50 MG tablet Take 50 mg by  mouth daily as needed.     lisinopril (ZESTRIL) 5 MG tablet Take 5 mg by mouth daily.     melatonin 5 MG TABS Take 5 mg by mouth at bedtime.     nystatin (MYCOSTATIN/NYSTOP) powder Apply 1 application topically 3 (three) times daily.     nystatin-triamcinolone (MYCOLOG II) cream Apply 1 application topically 2 (two) times daily.     Omega-3 Fatty Acids (FISH OIL) 1000 MG CAPS Take 1 capsule by mouth in the morning and at bedtime.     polyethylene glycol (MIRALAX / GLYCOLAX) 17 g packet Take 17 g by mouth daily. (Patient not taking: Reported on 04/22/2021)     propranolol (INDERAL) 10 MG tablet Take 10 mg by mouth 3 (three) times daily.       Musculoskeletal: Strength & Muscle Tone: within normal limits Gait & Station: normal Patient   Psychiatric Specialty Exam:  Presentation  General Appearance:  Casual Eye Contact: Fair Speech: Clear and Coherent Speech Volume: Normal Handedness: No data recorded  Mood and Affect  Mood: Dysphoric Affect: Blunt; Congruent  Thought Process  Thought Processes: Other (comment) (concrete) Descriptions of Associations:Circumstantial Orientation:Partial Thought Content:Tangential; Other (comment) (concrete) History of Schizophrenia/Schizoaffective disorder:No  Duration of Psychotic Symptoms:No data recorded Hallucinations:Hallucinations: None Ideas of Reference:None Suicidal Thoughts:Suicidal Thoughts: Yes, Passive (chronic) SI Passive Intent and/or Plan: Without Intent; Without Plan Homicidal Thoughts:Homicidal Thoughts: No  Sensorium  Memory: Remote Fair; Immediate Fair; Recent Fair Judgment: Impaired (hx of IDD) Insight: No data recorded  Executive Functions  Concentration: Fair Attention Span: Fair Recall: [place] of Knowledge: Poor Language: Fair  Psychomotor Activity  Psychomotor Activity: Psychomotor Activity: Normal  Assets  Assets: Physical Health; Resilience; Communication Skills; Desire for Improvement; Financial Resources/Insurance; Housing; Social Support  Sleep  Sleep: Sleep: Fair  Physical Exam: Physical Exam Vitals and nursing note reviewed.  Constitutional:      General: He is not in acute distress.    Appearance: Normal appearance. He is obese. He is not ill-appearing, toxic-appearing or diaphoretic.  HENT:     Head: Normocephalic.     Nose: Nose normal.     Mouth/Throat:     Mouth: Mucous membranes are moist.     Pharynx: Oropharynx is clear.  Eyes:     Pupils: Pupils are equal, round, and reactive to light.  Pulmonary:     Effort: Pulmonary effort is normal.  Musculoskeletal:        General: Normal range of  motion.     Cervical back: Normal range of motion.  Skin:    General: Skin is warm and dry.  Neurological:     Mental Status: He is alert. Mental status is at baseline.   Review of Systems  All other systems reviewed and are negative. Blood pressure 116/85, pulse 67, temperature 97.8 F (36.6 C), temperature source Oral, resp. rate 20, height '5\' 11"'$  (1.803 m), weight 101.2 kg, SpO2 97 %. Body mass index is 31.1 kg/m.  Treatment Plan Summary: Plan Patient has remained on continuous observation in the ED with no aggressive or self harming behaviors noted. Patient has history of IDD and lives in [place] where he receives 24 care and treatment; plan is to discharge patient back to [facility]  Disposition: No evidence of imminent risk to self or others at present.   Patient does not meet criteria for psychiatric inpatient admission. Supportive therapy provided about ongoing stressors. Discussed crisis plan, support from social network, calling 911, coming to the Emergency Department, and calling Suicide  Hotline.  Inda Merlin, NP 04/22/2021 12:35 PM

## 2021-04-22 NOTE — Progress Notes (Signed)
CSW provided the following resources for the patient to utilize upon discharge:  [facility] provide timely access to mental health services for children and adolescents (4-17) and adults presenting in a mental health crisis. The program is designed for those who need urgent Behavioral Health or Substance Use treatment and are not experiencing a medical crisis that would typically require an emergency room visit.    [place], [place] [address] Phone: [phone] [email or web address]   [facility] will also offer the following outpatient services: (Monday through Friday 8am-5pm)   Partial Hospitalization Program (PHP) Substance Abuse Intensive Outpatient Program (SA-IOP) Group Therapy Medication Management Peer Living Room   We also provide (24/7):    Assessments: Our mental health clinician and providers will conduct a focused mental health evaluation, assessing for immediate safety concerns and further mental health needs.   Referral: Our team will provide resources and help connect to community based mental health treatment, when indicated, including psychotherapy, psychiatry, and other specialized behavioral health or substance use disorder services (for those not already in treatment).   Transitional Care: Our team providers in person bridging and/or telphonic follow-up during the patient's transition to outpatient services.    Glennie Isle, MSW, [place], LCAS-A Phone: [phone] Disposition/TOC
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
## 2021-04-22 NOTE — ED Notes (Signed)
Redressed wound with bacitracin, gauze, and coban

## 2021-04-22 NOTE — Discharge Instructions (Signed)
[facility]will provide timely access to mental health services for children and adolescents (4-17) and adults presenting in a mental health crisis. The program is designed for those who need urgent Behavioral Health or Substance Use treatment and are not experiencing a medical crisis that would typically require an emergency room visit.    [address] [place], [place] [address] Phone: [phone] [email or web address]   [facility] will also offer the following outpatient services: (Monday through Friday 8am-5pm)    Partial Hospitalization Program (PHP)  Substance Abuse Intensive Outpatient Program (SA-IOP)  Group Therapy  Medication Management  Peer Living Room   We also provide (24/7):    Assessments: Our mental health clinician and providers will conduct a focused mental health evaluation, assessing for immediate safety concerns and further mental health needs.   Referral: Our team will provide resources and help connect to community based mental health treatment, when indicated, including psychotherapy, psychiatry, and other specialized behavioral health or substance use disorder services (for those not already in treatment).   Transitional Care: Our team providers in person bridging and/or telphonic follow-up during the patient's transition to outpatient services.      

## 2021-04-22 NOTE — Progress Notes (Addendum)
CSW spoke with patient's legal guardian Elmon Kirschner of [facility] to inform him of discharge plan.  CSW spoke with Mitzi Hansen, group home leader who states he will come pick up the patient.  CSW notified RN of information.  Madilyn Fireman, MSW, LCSW Transitions of Care  Clinical Social Worker II [phone]

## 2021-04-22 NOTE — BH Assessment (Signed)
Disposition:   Received an update from TTS staff Aldona Bar K.), patient to remain at the ED overnight for continuous observation. Pending am psych evaluation.

## 2021-06-04 ENCOUNTER — Emergency Department (HOSPITAL_COMMUNITY)
Admission: EM | Admit: 2021-06-04 | Discharge: 2021-06-08 | Disposition: A | Discharge status: Home / Self Care | Payer: Medicare Other | Attending: Emergency Medicine | Admitting: Emergency Medicine

## 2021-06-04 ENCOUNTER — Other Ambulatory Visit: Payer: Self-pay

## 2021-06-04 ENCOUNTER — Encounter (HOSPITAL_COMMUNITY): Payer: Self-pay | Admitting: Emergency Medicine

## 2021-06-04 DIAGNOSIS — Y900 Blood alcohol level of less than 20 mg/100 ml: Secondary | ICD-10-CM | POA: Diagnosis not present

## 2021-06-04 DIAGNOSIS — F17210 Nicotine dependence, cigarettes, uncomplicated: Secondary | ICD-10-CM | POA: Insufficient documentation

## 2021-06-04 DIAGNOSIS — E119 Type 2 diabetes mellitus without complications: Secondary | ICD-10-CM | POA: Insufficient documentation

## 2021-06-04 DIAGNOSIS — Z91040 Latex allergy status: Secondary | ICD-10-CM | POA: Insufficient documentation

## 2021-06-04 DIAGNOSIS — R440 Auditory hallucinations: Secondary | ICD-10-CM | POA: Diagnosis not present

## 2021-06-04 DIAGNOSIS — F259 Schizoaffective disorder, unspecified: Secondary | ICD-10-CM | POA: Insufficient documentation

## 2021-06-04 DIAGNOSIS — Z8659 Personal history of other mental and behavioral disorders: Secondary | ICD-10-CM

## 2021-06-04 DIAGNOSIS — R443 Hallucinations, unspecified: Secondary | ICD-10-CM | POA: Diagnosis present

## 2021-06-04 DIAGNOSIS — F79 Unspecified intellectual disabilities: Secondary | ICD-10-CM

## 2021-06-04 DIAGNOSIS — F430 Acute stress reaction: Secondary | ICD-10-CM

## 2021-06-04 DIAGNOSIS — Z20822 Contact with and (suspected) exposure to covid-19: Secondary | ICD-10-CM | POA: Insufficient documentation

## 2021-06-04 DIAGNOSIS — R45851 Suicidal ideations: Secondary | ICD-10-CM | POA: Insufficient documentation

## 2021-06-04 DIAGNOSIS — I10 Essential (primary) hypertension: Secondary | ICD-10-CM | POA: Insufficient documentation

## 2021-06-04 LAB — RAPID URINE DRUG SCREEN, HOSP PERFORMED
Amphetamines: NOT DETECTED
Barbiturates: NOT DETECTED
Benzodiazepines: NOT DETECTED
Cocaine: NOT DETECTED
Opiates: NOT DETECTED
Tetrahydrocannabinol: NOT DETECTED

## 2021-06-04 LAB — CBC
HCT: 46.8 % (ref 39.0–52.0)
Hemoglobin: 16.4 g/dL (ref 13.0–17.0)
MCH: 31 pg (ref 26.0–34.0)
MCHC: 35 g/dL (ref 30.0–36.0)
MCV: 88.5 fL (ref 80.0–100.0)
Platelets: 233 10*3/uL (ref 150–400)
RBC: 5.29 MIL/uL (ref 4.22–5.81)
RDW: 12.7 % (ref 11.5–15.5)
WBC: 8.6 10*3/uL (ref 4.0–10.5)
nRBC: 0 % (ref 0.0–0.2)

## 2021-06-04 LAB — COMPREHENSIVE METABOLIC PANEL
ALT: 16 U/L (ref 0–44)
AST: 16 U/L (ref 15–41)
Albumin: 3.6 g/dL (ref 3.5–5.0)
Alkaline Phosphatase: 49 U/L (ref 38–126)
Anion gap: 8 (ref 5–15)
BUN: 12 mg/dL (ref 6–20)
CO2: 25 mmol/L (ref 22–32)
Calcium: 9.2 mg/dL (ref 8.9–10.3)
Chloride: 104 mmol/L (ref 98–111)
Creatinine, Ser: 0.98 mg/dL (ref 0.61–1.24)
GFR, Estimated: >60 mL/min (ref >60)
Glucose, Bld: 93 mg/dL (ref 70–99)
Potassium: 4.3 mmol/L (ref 3.5–5.1)
Sodium: 137 mmol/L (ref 135–145)
Total Bilirubin: 1.1 mg/dL (ref 0.3–1.2)
Total Protein: 7.1 g/dL (ref 6.5–8.1)

## 2021-06-04 LAB — SALICYLATE LEVEL: Salicylate Lvl: <7 mg/dL — ABNORMAL LOW (ref 7.0–30.0)

## 2021-06-04 LAB — ACETAMINOPHEN LEVEL: Acetaminophen (Tylenol), Serum: <10 ug/mL — ABNORMAL LOW (ref 10–30)

## 2021-06-04 LAB — ETHANOL: Alcohol, Ethyl (B): <10 mg/dL (ref <10)
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
## 2021-06-04 NOTE — ED Notes (Signed)
Pt belongings went back to nursing station with pt.
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
## 2021-06-04 NOTE — ED Notes (Signed)
Pt changed into burgundy scrubs. All belongings were collected. Pt wanded by security. Pt resting back in triage.

## 2021-06-05 DIAGNOSIS — F259 Schizoaffective disorder, unspecified: Secondary | ICD-10-CM | POA: Diagnosis not present

## 2021-06-05 LAB — RESP PANEL BY RT-PCR (FLU A&B, COVID) ARPGX2
Influenza A by PCR: NEGATIVE
Influenza B by PCR: NEGATIVE
SARS Coronavirus 2 by RT PCR: NEGATIVE

## 2021-06-05 LAB — CBG MONITORING, ED
Glucose-Capillary: 106 mg/dL — ABNORMAL HIGH (ref 70–99)
Glucose-Capillary: 90 mg/dL (ref 70–99)
Glucose-Capillary: 107 mg/dL — ABNORMAL HIGH (ref 70–99)

## 2021-06-05 MED ORDER — CLONAZEPAM 0.5 MG PO TABS
2.0000 mg | ORAL_TABLET | Freq: Every day | ORAL | Status: DC
  Administered 2021-06-05 – 2021-06-08 (×4): 2 mg via ORAL
  Filled 2021-06-05 (×5): qty 4

## 2021-06-05 MED ORDER — ACETAMINOPHEN 325 MG PO TABS
650.0000 mg | ORAL_TABLET | Freq: Four times a day (QID) | ORAL | Status: DC | PRN
  Administered 2021-06-05 – 2021-06-08 (×5): 650 mg via ORAL
  Filled 2021-06-05 (×6): qty 2

## 2021-06-05 MED ORDER — LORATADINE 10 MG PO TABS
10.0000 mg | ORAL_TABLET | Freq: Every day | ORAL | Status: DC
  Administered 2021-06-05 – 2021-06-08 (×4): 10 mg via ORAL
  Filled 2021-06-05 (×4): qty 1

## 2021-06-05 MED ORDER — PROPRANOLOL HCL 10 MG PO TABS
10.0000 mg | ORAL_TABLET | Freq: Three times a day (TID) | ORAL | Status: DC
  Administered 2021-06-05 – 2021-06-08 (×11): 10 mg via ORAL
  Filled 2021-06-05 (×13): qty 1

## 2021-06-05 MED ORDER — FLUOXETINE HCL 20 MG PO CAPS
40.0000 mg | ORAL_CAPSULE | Freq: Every day | ORAL | Status: DC
  Administered 2021-06-05 – 2021-06-08 (×4): 40 mg via ORAL
  Filled 2021-06-05 (×4): qty 2

## 2021-06-05 MED ORDER — DIVALPROEX SODIUM 250 MG PO DR TAB
500.0000 mg | DELAYED_RELEASE_TABLET | Freq: Two times a day (BID) | ORAL | Status: DC
  Administered 2021-06-05 – 2021-06-08 (×7): 500 mg via ORAL
  Filled 2021-06-05 (×8): qty 2

## 2021-06-05 MED ORDER — MELATONIN 5 MG PO TABS
5.0000 mg | ORAL_TABLET | Freq: Every evening | ORAL | Status: DC | PRN
  Administered 2021-06-05 (×2): 5 mg via ORAL
  Filled 2021-06-05 (×2): qty 1

## 2021-06-05 MED ORDER — BENZTROPINE MESYLATE 1 MG PO TABS
1.0000 mg | ORAL_TABLET | Freq: Two times a day (BID) | ORAL | Status: DC
  Administered 2021-06-05 – 2021-06-08 (×7): 1 mg via ORAL
  Filled 2021-06-05 (×7): qty 1

## 2021-06-05 MED ORDER — NICOTINE POLACRILEX 2 MG MT GUM
2.0000 mg | CHEWING_GUM | OROMUCOSAL | Status: DC | PRN
  Administered 2021-06-05 – 2021-06-07 (×5): 2 mg via ORAL
  Filled 2021-06-05 (×4): qty 1

## 2021-06-05 MED ORDER — DOCUSATE SODIUM 100 MG PO CAPS
100.0000 mg | ORAL_CAPSULE | Freq: Every day | ORAL | Status: DC
  Administered 2021-06-05 – 2021-06-08 (×4): 100 mg via ORAL
  Filled 2021-06-05 (×4): qty 1

## 2021-06-05 MED ORDER — LISINOPRIL 2.5 MG PO TABS
5.0000 mg | ORAL_TABLET | Freq: Every day | ORAL | Status: DC
  Administered 2021-06-05 – 2021-06-08 (×4): 5 mg via ORAL
  Filled 2021-06-05 (×2): qty 2
  Filled 2021-06-05: qty 1
  Filled 2021-06-05: qty 2

## 2021-06-05 MED ORDER — HALOPERIDOL 5 MG PO TABS
5.0000 mg | ORAL_TABLET | Freq: Once | ORAL | Status: AC
  Administered 2021-06-05: 5 mg via ORAL
  Filled 2021-06-05: qty 1
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
## 2021-06-05 NOTE — ED Notes (Signed)
Pt making phone call at this time - pt informed that this would be last phone call of the day and agreed to 5 minutes and to be calm on the phone

## 2021-06-05 NOTE — ED Notes (Signed)
Belongings placed in locker 2.  

## 2021-06-05 NOTE — ED Notes (Signed)
Patient is actively experiencing auditory hallucinations telling him to slit his throat. This RN has attempted to redirect patient with therapeutic  communication. He is still currently having the hallucinations.

## 2021-06-05 NOTE — ED Notes (Signed)
Pt made 1st phone call to legal guardian

## 2021-06-05 NOTE — ED Notes (Signed)
Pt was given personal hygiene products and clean scrubs and sitter took pt to get a shower.

## 2021-06-05 NOTE — Progress Notes (Signed)
   06/05/21 1725  Clinical Encounter Type  Visited With Patient  Visit Type Spiritual support  Referral From Nurse  Consult/Referral To Chaplain   The patient said he is here because he hears voices telling him to harm himself. The patient requested a large print Bible and said he does not read very well. Chaplain advised we do not have large print; Chaplain offered to print Bible verses, and the patient expressed his appreciation.   Prayer offered. This note was prepared by Jeanine Luz, M.Div..  For questions please contact by phone [phone].

## 2021-06-05 NOTE — ED Notes (Signed)
Paged chaplain to speak with patient per the patients request

## 2021-06-05 NOTE — ED Notes (Signed)
Pt requesting snack at this time. Pt given cheese and soda. Pt cooperative at this time.

## 2021-06-06 ENCOUNTER — Encounter (HOSPITAL_COMMUNITY): Payer: Self-pay | Admitting: Registered Nurse

## 2021-06-06 DIAGNOSIS — R440 Auditory hallucinations: Secondary | ICD-10-CM

## 2021-06-06 DIAGNOSIS — F259 Schizoaffective disorder, unspecified: Secondary | ICD-10-CM

## 2021-06-06 DIAGNOSIS — R45851 Suicidal ideations: Secondary | ICD-10-CM | POA: Diagnosis not present

## 2021-06-06 LAB — VALPROIC ACID LEVEL: Valproic Acid Lvl: 76 ug/mL (ref 50.0–100.0)

## 2021-06-06 LAB — CBG MONITORING, ED
Glucose-Capillary: 103 mg/dL — ABNORMAL HIGH (ref 70–99)
Glucose-Capillary: 122 mg/dL — ABNORMAL HIGH (ref 70–99)
Glucose-Capillary: 97 mg/dL (ref 70–99)

## 2021-06-06 MED ORDER — OMEGA-3-ACID ETHYL ESTERS 1 G PO CAPS
1000.0000 mg | ORAL_CAPSULE | Freq: Every morning | ORAL | Status: DC
  Administered 2021-06-06 – 2021-06-07 (×2): 1000 mg via ORAL
  Filled 2021-06-06 (×2): qty 1

## 2021-06-06 MED ORDER — HALOPERIDOL 5 MG PO TABS
10.0000 mg | ORAL_TABLET | Freq: Every day | ORAL | Status: DC
  Administered 2021-06-06 – 2021-06-07 (×2): 10 mg via ORAL
  Filled 2021-06-06 (×2): qty 2

## 2021-06-06 MED ORDER — HALOPERIDOL 5 MG PO TABS
5.0000 mg | ORAL_TABLET | Freq: Two times a day (BID) | ORAL | Status: DC
  Administered 2021-06-06 – 2021-06-08 (×4): 5 mg via ORAL
  Filled 2021-06-06 (×4): qty 1

## 2021-06-06 MED ORDER — MELATONIN 5 MG PO TABS
5.0000 mg | ORAL_TABLET | Freq: Every day | ORAL | Status: DC
  Administered 2021-06-06 – 2021-06-07 (×2): 5 mg via ORAL
  Filled 2021-06-06 (×2): qty 1

## 2021-06-06 MED ORDER — BENZONATATE 100 MG PO CAPS
200.0000 mg | ORAL_CAPSULE | Freq: Three times a day (TID) | ORAL | Status: DC | PRN
  Administered 2021-06-06 – 2021-06-08 (×5): 200 mg via ORAL
  Filled 2021-06-06 (×6): qty 2

## 2021-06-06 NOTE — Progress Notes (Signed)
Follow up with obtaining Psychological Evaluation:  CSW attempted to contact pt's legal guardian Elmon Kirschner from [employer] [phone] in attempt to obtain pt's most recent psychological. CSW left a voicemail requesting a phone call back. CSW then called the back upline for pt's legal guardian [phone] and spoke with Jonte who advised that she has limited access to pt information and would take a message to have Mr. [name] call back.  CSW contacted pt's LME [phone] and spoke with Madan who advised that there are multiple documents in pt's record. CSW was advised from [place] that he was able to view a psych evaluation from 2019. Madan reported that pt's Care Coordinator is Desire Edison Pace [phone]. CSW attempted to contact Desire Edison Pace and left a voicemail requesting a phone call back.   CSW will continue to assist and follow in coordination with placement for pt.

## 2021-06-06 NOTE — ED Notes (Signed)
Patient has had his 2 phone calls for the day

## 2021-06-06 NOTE — ED Notes (Signed)
TTS at bedside. 

## 2021-06-06 NOTE — Progress Notes (Signed)
CSW initiated Diversion process with Ronny Flurry from [facility]). Ebony Hail has requested for referral packet and a copy of the Pt's most recent psychological be sent to her via email. CSW consulted with ED staff regarding the possibility of receiving psychological from the Pt's mother. CSW has agreed to submit referral packet via email with documentation from current ED visit.   Mariea Clonts, MSW, LCSW-A  11:59 AM 06/06/2021

## 2021-06-06 NOTE — Consult Note (Signed)
Telepsych Consultation   Reason for Consult:  Auditory hallucinations and suicidal ideaton Referring Physician:  Antonietta Breach, PA-C Location of Patient: [facility] ED Location of Provider: Other: [facility]  Patient Identification: Jordan Gray MRN:  [id number] Principal Diagnosis: Schizoaffective disorder ([place]) Diagnosis:  Principal Problem:   Schizoaffective disorder ([place]) Active Problems:   Intellectual disability   Suicidal ideation   Auditory hallucination   Total Time spent with patient: 30 minutes  Subjective:   Jordan Gray is a 40 y.o. male patient with a history of schizoaffective disorder and intellectual disability was [facility] with complaints of experiencing hallucinations telling him to hurt himself and others.  Patient also kicked out a window at assisted living facilitiy with the intention of using a glass to cut himself.  HPI:  Jordan Gray, 40 y.o., male patient seen via tele health by this provider, consulted with Dr. Hampton Abbot; and chart reviewed on 06/06/21.  On evaluation Jordan Gray reports he came to the emergency room because he was hearing voices telling him to cut himself and hurt other people.  Patient reports he last heard the voices before breakfast this morning.  Patient also endorses suicidal ideation with a plan to cut himself.  Patient states he is standing in a assistant living facility "it's just me and my caretaker and another lady that takes me out (for outings).  Patient reports he is compliant with his medications.  Patient also states he has outpatient psychiatric services but has been doing his visits via telephone.  Patient also reporting a chronic history of suicide since the death of his mother in 12/30/2002.  Patient reporting he has suicidal thoughts at least 2 days a week which is making it worse at this time is the command hallucinations.  Patient reporting he does not feel safe because of the voices telling him to hurt himself and  he does not trust himself not to listen to what they are saying. During evaluation ALERIC ARGENTI is elevated up in bed in no acute distress.  He is alert, oriented x 3, calm and cooperative.  His mood is dysphoric with congruent affect.  He does not appear to be responding to internal/external stimuli or delusional thoughts at this time but chart review indicates patient has been actively hallucinating; patient is also endorsing command auditory hallucinations.  Patient denies homicidal ideation and paranoia; but continues to endorse suicidal ideation and is unable to contract for safety.Patient answered question appropriately.  Per collateral information gathered by TTS counselor:  "TTS contacted Jordan Gray at [phone] for collateral information. He says Pt's account of event today is accurate. He says he has worked with Pt for two years and this is not his baseline. He says Pt has been complaining more frequently about auditory hallucinations and trying to calm himself. He describes Pt's mood as "up and down" and says Pt has been more anxious and irritable than normal. He says Pt recently had a tele-health visit with his psychiatrist, Dr. Dan Europe, and Pt's medications were renewed. He cannot identify any unusual stressors Pt may be experiencing. He confirmed that Pt's legal guardian is [name] from [employer] and he can be reached at [phone].  Past Psychiatric History: See above  Risk to Self: Yes Risk to Others: Yes Prior Inpatient Therapy: Yes Prior Outpatient Therapy: Yes  Past Medical History:  Past Medical History:  Diagnosis Date   Diabetes mellitus without complication ([place])  MR (mental retardation)    Schizoaffective disorder ([place])     Past Surgical History:  Procedure Laterality Date   APPENDECTOMY     HIP SURGERY Left 2007   Family History:  Family History  Problem Relation Age of Onset   Throat cancer Mother    Esophageal cancer Mother     Diabetes Father    Colon cancer Neg Hx    Rectal cancer Neg Hx    Stomach cancer Neg Hx    Family Psychiatric  History: Unaware Social History:  Social History   Substance and Sexual Activity  Alcohol Use No     Social History   Substance and Sexual Activity  Drug Use No    Social History   Socioeconomic History   Marital status: Single    Spouse name: Not on file   Number of children: Not on file   Years of education: Not on file   Highest education level: Not on file  Occupational History   Not on file  Tobacco Use   Smoking status: Every Day    Packs/day: 0.25    Types: Cigarettes   Smokeless tobacco: Never  Vaping Use   Vaping Use: Never used  Substance and Sexual Activity   Alcohol use: No   Drug use: No   Sexual activity: Not on file  Other Topics Concern   Not on file  Social History Narrative   Not on file   Social Determinants of Health   Financial Resource Strain: Not on file  Food Insecurity: Not on file  Transportation Needs: Not on file  Physical Activity: Not on file  Stress: Not on file  Social Connections: Not on file   Additional Social History:    Allergies:   Allergies  Allergen Reactions   Bactrim [Sulfamethoxazole-Trimethoprim] Hives   Bee Venom Swelling   Latex Hives   Other     Nicotine patch    Labs:  Results for orders placed or performed during the hospital encounter of 06/04/21 (from the past 48 hour(s))  Rapid urine drug screen (hospital performed)     Status: None   Collection Time: 06/04/21  8:21 PM  Result Value Ref Range   Opiates NONE DETECTED NONE DETECTED   Cocaine NONE DETECTED NONE DETECTED   Benzodiazepines NONE DETECTED NONE DETECTED   Amphetamines NONE DETECTED NONE DETECTED   Tetrahydrocannabinol NONE DETECTED NONE DETECTED   Barbiturates NONE DETECTED NONE DETECTED    Comment: (NOTE) DRUG SCREEN FOR MEDICAL PURPOSES ONLY.  IF CONFIRMATION IS NEEDED FOR ANY PURPOSE, NOTIFY LAB WITHIN 5  DAYS.  LOWEST DETECTABLE LIMITS FOR URINE DRUG SCREEN Drug Class                     Cutoff (ng/mL) Amphetamine and metabolites    1000 Barbiturate and metabolites    200 Benzodiazepine                 [id number] Tricyclics and metabolites     300 Opiates and metabolites        300 Cocaine and metabolites        300 THC                            50 Performed at [facility], [place] [address], [place], [place] [address]   Comprehensive metabolic panel     Status: None   Collection Time: 06/04/21  8:24 PM  Result Value Ref Range   Sodium 137 135 - 145 mmol/L   Potassium 4.3 3.5 - 5.1 mmol/L   Chloride 104 98 - 111 mmol/L   CO2 25 22 - 32 mmol/L   Glucose, Bld 93 70 - 99 mg/dL    Comment: Glucose reference range applies only to samples taken after fasting for at least 8 hours.   BUN 12 6 - 20 mg/dL   Creatinine, Ser 0.98 0.61 - 1.24 mg/dL   Calcium 9.2 8.9 - 10.3 mg/dL   Total Protein 7.1 6.5 - 8.1 g/dL   Albumin 3.6 3.5 - 5.0 g/dL   AST 16 15 - 41 U/L   ALT 16 0 - 44 U/L   Alkaline Phosphatase 49 38 - 126 U/L   Total Bilirubin 1.1 0.3 - 1.2 mg/dL   GFR, Estimated >60 >60 mL/min    Comment: (NOTE) Calculated using the CKD-EPI Creatinine Equation (2021)    Anion gap 8 5 - 15    Comment: Performed at [facility] [address] [place], [place] [address]  Ethanol     Status: None   Collection Time: 06/04/21  8:24 PM  Result Value Ref Range   Alcohol, Ethyl (B) <10 <10 mg/dL    Comment: (NOTE) Lowest detectable limit for serum alcohol is 10 mg/dL.  For medical purposes only. Performed at [facility], [place] [address], [place], [place] [id number]   Salicylate level     Status: Abnormal   Collection Time: 06/04/21  8:24 PM  Result Value Ref Range   Salicylate Lvl [id number] (L) 7.0 - 30.0 mg/dL    Comment: Performed at [facility] [address], [place], [place] [address]  Acetaminophen level     Status: Abnormal   Collection Time: 06/04/21  8:24 PM   Result Value Ref Range   Acetaminophen (Tylenol), Serum <10 (L) 10 - 30 ug/mL    Comment: Performed at [facility], [place] [address], [place], [place] [address]  cbc     Status: None   Collection Time: 06/04/21  8:24 PM  Result Value Ref Range   WBC 8.6 4.0 - 10.5 K/uL   RBC 5.29 4.22 - 5.81 MIL/uL   Hemoglobin 16.4 13.0 - 17.0 g/dL   HCT 46.8 39.0 - 52.0 %   MCV 88.5 80.0 - 100.0 fL   MCH 31.0 26.0 - 34.0 pg   MCHC 35.0 30.0 - 36.0 g/dL   RDW 12.7 11.5 - 15.5 %   Platelets 233 150 - 400 K/uL   nRBC 0.0 0.0 - 0.2 %    Comment: Performed at [facility], [place] [address] [place],  [address]  Resp Panel by RT-PCR (Flu A&B, Covid) Nasopharyngeal Swab     Status: None   Collection Time: 06/04/21 11:08 PM   Specimen: Nasopharyngeal Swab; Nasopharyngeal(NP) swabs in vial transport medium  Result Value Ref Range   SARS Coronavirus 2 by RT PCR NEGATIVE NEGATIVE    Comment: (NOTE) SARS-CoV-2 target nucleic acids are NOT DETECTED.  The SARS-CoV-2 RNA is generally detectable in upper respiratory specimens during the acute phase of infection. The lowest concentration of SARS-CoV-2 viral copies this assay can detect is 138 copies/mL. A negative result does not preclude SARS-Cov-2 infection and should not be used as the sole basis for treatment or other patient management decisions. A negative result may occur with  improper specimen collection/handling, submission of specimen other than nasopharyngeal swab, presence of  viral mutation(s) within the areas targeted by this assay, and inadequate number of viral copies(<138 copies/mL). A negative result must be combined with clinical observations, patient history, and epidemiological information. The expected result is Negative.  Fact Sheet for Patients:  EntrepreneurPulse.com.au  Fact Sheet for Healthcare Providers:  IncredibleEmployment.be  This test is no t yet approved or cleared by  the [place] FDA and  has been authorized for detection and/or diagnosis of SARS-CoV-2 by FDA under an Emergency Use Authorization (EUA). This EUA will remain  in effect (meaning this test can be used) for the duration of the COVID-19 declaration under Section 564(b)(1) of the Act, 21 U.S.C.section 360bbb-3(b)(1), unless the authorization is terminated  or revoked sooner.       Influenza A by PCR NEGATIVE NEGATIVE   Influenza B by PCR NEGATIVE NEGATIVE    Comment: (NOTE) The Xpert Xpress SARS-CoV-2/FLU/RSV plus assay is intended as an aid in the diagnosis of influenza from Nasopharyngeal swab specimens and should not be used as a sole basis for treatment. Nasal washings and aspirates are unacceptable for Xpert Xpress SARS-CoV-2/FLU/RSV testing.  Fact Sheet for Patients: EntrepreneurPulse.com.au  Fact Sheet for Healthcare Providers: IncredibleEmployment.be  This test is not yet approved or cleared by the [place] FDA and has been authorized for detection and/or diagnosis of SARS-CoV-2 by FDA under an Emergency Use Authorization (EUA). This EUA will remain in effect (meaning this test can be used) for the duration of the COVID-19 declaration under Section 564(b)(1) of the Act, 21 U.S.C. section 360bbb-3(b)(1), unless the authorization is terminated or revoked.  Performed at [facility], [place] [address] [place], [place] [address]   POC CBG, ED     Status: Abnormal   Collection Time: 06/05/21 10:31 AM  Result Value Ref Range   Glucose-Capillary 106 (H) 70 - 99 mg/dL    Comment: Glucose reference range applies only to samples taken after fasting for at least 8 hours.  POC CBG, ED     Status: None   Collection Time: 06/05/21  4:57 PM  Result Value Ref Range   Glucose-Capillary 90 70 - 99 mg/dL    Comment: Glucose reference range applies only to samples taken after fasting for at least 8 hours.   Comment 1 Notify RN     Comment 2 Document in Chart   POC CBG, ED     Status: Abnormal   Collection Time: 06/05/21  9:08 PM  Result Value Ref Range   Glucose-Capillary 107 (H) 70 - 99 mg/dL    Comment: Glucose reference range applies only to samples taken after fasting for at least 8 hours.  POC CBG, ED     Status: Abnormal   Collection Time: 06/06/21  8:19 AM  Result Value Ref Range   Glucose-Capillary 103 (H) 70 - 99 mg/dL    Comment: Glucose reference range applies only to samples taken after fasting for at least 8 hours.    Medications:  Current Facility-Administered Medications  Medication Dose Route Frequency Provider Last Rate Last Admin   acetaminophen (TYLENOL) tablet 650 mg  650 mg Oral Q6H PRN Antonietta Breach, PA-C   650 mg at 06/05/21 1726   benzonatate (TESSALON) capsule 200 mg  200 mg Oral TID PRN Hayden Rasmussen, MD   200 mg at 06/06/21 0931   benztropine (COGENTIN) tablet 1 mg  1 mg Oral BID Antonietta Breach, PA-C   1 mg at 06/06/21 [id number]   clonazePAM (KLONOPIN) tablet 2 mg  2  mg Oral Daily Antonietta Breach, PA-C   2 mg at 06/06/21 [id number]   divalproex (DEPAKOTE) DR tablet 500 mg  500 mg Oral BID Antonietta Breach, PA-C   500 mg at 06/06/21 0820   docusate sodium (COLACE) capsule 100 mg  100 mg Oral Daily Antonietta Breach, PA-C   100 mg at 06/06/21 0819   FLUoxetine (PROZAC) capsule 40 mg  40 mg Oral Daily Antonietta Breach, PA-C   40 mg at 06/06/21 [id number]   lisinopril (ZESTRIL) tablet 5 mg  5 mg Oral Daily Antonietta Breach, PA-C   5 mg at 06/06/21 [id number]   loratadine (CLARITIN) tablet 10 mg  10 mg Oral Daily Antonietta Breach, PA-C   10 mg at 06/06/21 0820   melatonin tablet 5 mg  5 mg Oral QHS PRN Antonietta Breach, PA-C   5 mg at 06/05/21 2228   nicotine polacrilex (NICORETTE) gum 2 mg  2 mg Oral PRN Noemi Chapel, MD   2 mg at 06/05/21 1547   propranolol (INDERAL) tablet 10 mg  10 mg Oral TID Antonietta Breach, PA-C   10 mg at 06/06/21 [id number]   Current Outpatient Medications  Medication Sig Dispense Refill   benztropine (COGENTIN) 1 MG  tablet Take 1 mg by mouth 2 (two) times daily.     cetirizine (ZYRTEC) 10 MG tablet Take 10 mg by mouth daily.     clonazePAM (KLONOPIN) 2 MG tablet Take 2 mg by mouth daily.     divalproex (DEPAKOTE) 500 MG DR tablet Take 500 mg by mouth 2 (two) times daily.     docusate sodium (COLACE) 100 MG capsule Take 100 mg by mouth daily.     FLUoxetine (PROZAC) 40 MG capsule Take 40 mg by mouth daily.     fluticasone (FLONASE) 50 MCG/ACT nasal spray Place 1 spray into both nostrils daily.     haloperidol (HALDOL) 10 MG tablet Take 10 mg by mouth at bedtime.     haloperidol (HALDOL) 5 MG tablet Take 5 mg by mouth 2 (two) times daily.     lisinopril (ZESTRIL) 5 MG tablet Take 5 mg by mouth daily.     melatonin 5 MG TABS Take 5 mg by mouth at bedtime.     nicotine polacrilex (NICORETTE) 4 MG gum Take 4 mg by mouth as directed.     nystatin (MYCOSTATIN/NYSTOP) powder Apply 1 application topically 3 (three) times daily as needed (buttocks rash).     nystatin-triamcinolone (MYCOLOG II) cream Apply 1 application topically 2 (two) times daily as needed (buttocks rash).     Omega-3 Fatty Acids (FISH OIL) 1000 MG CAPS Take 1,000 mg by mouth in the morning and at bedtime.     polyethylene glycol (MIRALAX / GLYCOLAX) 17 g packet Take 17 g by mouth daily.     propranolol (INDERAL) 10 MG tablet Take 10 mg by mouth 3 (three) times daily.      Musculoskeletal: Strength & Muscle Tone: within normal limits Gait & Station: normal Patient leans: N/A   Psychiatric Specialty Exam:  Presentation  General Appearance: Appropriate for Environment  Eye Contact:Good  Speech:Clear and Coherent; Normal Rate  Speech Volume:Normal  Handedness:Right   Mood and Affect  Mood:Dysphoric  Affect:Congruent   Thought Process  Thought Processes:Goal Directed  Descriptions of Associations:Circumstantial  Orientation:Full (Time, Place and Person)  Thought Content:WDL  History of Schizophrenia/Schizoaffective  disorder:Yes  Duration of Psychotic Symptoms:Greater than six months  Hallucinations:Hallucinations: Auditory Description of Auditory Hallucinations: Reports he is hearing voices telling him  to hurt himself and others  Ideas of Reference:None  Suicidal Thoughts:Suicidal Thoughts: Yes, Active (States having thoughts of cutting himself) SI Active Intent and/or Plan: Without Intent; With Plan  Homicidal Thoughts:Homicidal Thoughts: No (Reports he is having no thoughts of wanting to kill anyone but voices are telling him to hurt others)   Sensorium  Memory:Immediate Good; Recent Good  Judgment:Impaired (hx of IDD)  Insight:Fair; Present   Executive Functions  Concentration:Good  Attention Span:Good  [place]   Psychomotor Activity  Psychomotor Activity:Psychomotor Activity: Normal   Assets  Assets:Communication Skills; Desire for Improvement; Financial Resources/Insurance; Housing; Social Support; Transportation   Sleep  Sleep:Sleep: Good    Physical Exam: Physical Exam Vitals and nursing note reviewed. Exam conducted with a chaperone present.  Constitutional:      General: He is not in acute distress.    Appearance: Normal appearance. He is not ill-appearing.  Cardiovascular:     Rate and Rhythm: Normal rate.  Pulmonary:     Effort: Pulmonary effort is normal.  Neurological:     Mental Status: He is alert and oriented to person, place, and time.  Psychiatric:        Attention and Perception: Attention normal. He perceives auditory hallucinations.        Mood and Affect: Mood is anxious and depressed.        Speech: Speech normal.        Behavior: Behavior normal. Behavior is cooperative.        Thought Content: Thought content is not paranoid or delusional. Thought content includes suicidal ideation. Thought content does not include homicidal ideation. Thought content includes suicidal plan.        Judgment:  Judgment is impulsive.   Review of Systems  Constitutional: Negative.   HENT: Negative.    Eyes: Negative.   Respiratory: Negative.    Cardiovascular: Negative.   Gastrointestinal: Negative.   Genitourinary: Negative.   Musculoskeletal: Negative.   Skin: Negative.   Neurological: Negative.   Endo/Heme/Allergies: Negative.   Psychiatric/Behavioral:  Positive for depression, hallucinations and suicidal ideas. Negative for substance abuse. The patient is nervous/anxious.   Blood pressure 118/87, pulse 76, temperature 97.7 F (36.5 C), temperature source Oral, resp. rate 16, SpO2 96 %. There is no height or weight on file to calculate BMI.  Treatment Plan Summary: Daily contact with patient to assess and evaluate symptoms and progress in treatment, Medication management, and Plan inpatient psychiatric treatment  Medication Management: Current home medications are the following: Cogentin 1 mg twice daily Klonopin 2 mg daily Depakote 500 mg twice daily Prozac 40 mg daily Haldol 10 mg daily at bedtime Haldol 5 mg twice daily  Will restart home medications and order valproic acid level and ECG. Last noted Valproic acid 08/04/2020 result < 37.  Last noted ECG 08/05/2020 with QTc 447   Disposition: Recommend psychiatric Inpatient admission when medically cleared.  This service was provided via telemedicine using a 2-way, interactive audio and video technology.  Names of all persons participating in this telemedicine service and their role in this encounter. Name: Earleen Newport Role: NP  Name: Dr. Hampton Abbot Role: Psychiatrist  Name: Magda Paganini Role: Patient  Name:  Role:    Secure message sent to patient's nurse Santiago Glad call, RN informing: Psychiatric consult complete and patient is recommended for inpatient psychiatric treatment.  If no appropriate bed available at [facility] patient will be faxed out to surrounding facilities for appropriate bed.  Please  inform MD only default  listed.  Zayd Bonet, NP 06/06/2021 10:05 AM

## 2021-06-06 NOTE — ED Notes (Addendum)
error 

## 2021-06-06 NOTE — ED Notes (Signed)
Pt coughing  continuously - requested cough meds from EDP.

## 2021-06-06 NOTE — Progress Notes (Signed)
Patient has been denied by [facility] due to IDD diagnoses and has been recommended to be faxed out. Patient meets inpatient criteria per [facility] Bobbitt,NP. Patient referred to the following facilities:  [facility]  [address] [place] [address] [phone] [place]  [address] [place] [address] [phone]  [facility]  [address] [place] [address] [phone] [address] [place] [address] [phone]  [facility]  [place], [place] [address] [phone] [place], [place] [address] [facility]  [id number] [place] [address] [phone] [facility]  [place] [address] [place] [address] [place]  [facility]  [address], [place] [address] [phone]  [facility]  [address], [place] [address] [phone]    CSW will continue to monitor disposition.    Mariea Clonts, MSW, LCSW-A  11:22 AM 06/06/2021

## 2021-06-07 DIAGNOSIS — F259 Schizoaffective disorder, unspecified: Secondary | ICD-10-CM | POA: Diagnosis not present

## 2021-06-07 LAB — CBG MONITORING, ED
Glucose-Capillary: 90 mg/dL (ref 70–99)
Glucose-Capillary: 84 mg/dL (ref 70–99)
Glucose-Capillary: 93 mg/dL (ref 70–99)

## 2021-06-07 MED ORDER — ALBUTEROL SULFATE HFA 108 (90 BASE) MCG/ACT IN AERS
2.0000 | INHALATION_SPRAY | Freq: Four times a day (QID) | RESPIRATORY_TRACT | Status: DC | PRN
  Administered 2021-06-07 – 2021-06-08 (×3): 2 via RESPIRATORY_TRACT
  Filled 2021-06-07: qty 6.7

## 2021-06-07 NOTE — Progress Notes (Signed)
FOLLOW UP ON DIVERSION:   CSW received psychological evaluation from [place]. CSW emailed completed diversion paperwork with referral to [facility] START Care Coordinator Wyatt Portela, who is the representative that is on call. Per Con Memos, she is on the way to complete a safety assessment on the Pt. Con Memos informed CSW that she spoke with the Pt's group home who agreed to accept the Pt back if he is stabilized on his medications. Con Memos has agreed to follow-up with the CSW regarding the findings of her safety assessment and disposition.    Mariea Clonts, MSW, LCSW-A  12:38 PM 06/07/2021

## 2021-06-07 NOTE — ED Notes (Signed)
Patient using phone at nurses station.

## 2021-06-07 NOTE — ED Notes (Signed)
Report given to Delmar Landau, RN

## 2021-06-07 NOTE — ED Notes (Signed)
Patient given cheese, crackers, and soda per patients request. Denies any further needs at this time.

## 2021-06-07 NOTE — Progress Notes (Signed)
CSW informed of update from Diego Cory, FNP pt is in the process of a possible discharge plan back to his group home. Pt now has a Soil scientist for safety.   CSW and TOC team to follow up with providers to ensure updated Disposition and recommendations.

## 2021-06-07 NOTE — ED Notes (Signed)
Patient requesting additional cough medication at this time. This RN explained that patient had a recent dose and more time was needed between doses. Patient expresses understanding.

## 2021-06-07 NOTE — ED Notes (Signed)
The pt is asking for cough medicine he has not been coughing until he asked for the med  med given  he also wants a nose spray  I will tell the next nurse for the am

## 2021-06-07 NOTE — ED Notes (Signed)
The pt is constantly at   the desk  he now needs  a laxative ordered  he reports that he takes it  when hes home  message will be given to relay to staff on the weekend

## 2021-06-07 NOTE — ED Notes (Signed)
Patient approached this RN and requested nicotine gum. Medication given per [facility]. Patient denies any further needs at this time.

## 2021-06-07 NOTE — ED Notes (Signed)
The pt is asking for his nicoret gum

## 2021-06-07 NOTE — ED Notes (Signed)
Pt requesting tylenol

## 2021-06-08 DIAGNOSIS — F259 Schizoaffective disorder, unspecified: Secondary | ICD-10-CM | POA: Diagnosis not present

## 2021-06-08 LAB — CBG MONITORING, ED: Glucose-Capillary: 93 mg/dL (ref 70–99)

## 2021-06-08 MED ORDER — OLANZAPINE 5 MG PO TABS
5.0000 mg | ORAL_TABLET | Freq: Two times a day (BID) | ORAL | Status: DC
  Administered 2021-06-08: 5 mg via ORAL
  Filled 2021-06-08: qty 1

## 2021-06-08 MED ORDER — OLANZAPINE 5 MG PO TABS
5.0000 mg | ORAL_TABLET | Freq: Every day | ORAL | 0 refills | Status: AC
Start: 2021-06-08 — End: ?

## 2021-06-08 MED ORDER — OLANZAPINE 5 MG PO TABS: 5.0000 mg | ORAL_TABLET | Freq: Every day | ORAL | Status: DC

## 2021-06-08 NOTE — ED Notes (Signed)
SW and Case Mgmt have been informed of the DC concerns as well.

## 2021-06-08 NOTE — Progress Notes (Signed)
Multiple phone calls and text messages have been sent to the legal guardian with no answer. [facility] crisis and agency number are not responding.

## 2021-06-08 NOTE — ED Provider Notes (Signed)
Emergency Medicine Observation Re-evaluation Note  Jordan Gray is a 40 y.o. male, seen on rounds today.  Pt initially presented to the ED for complaints of Hallucinations Currently, the patient is resting.  Physical Exam  BP 120/81 (BP Location: Right Arm)   Pulse 70   Temp 98.1 F (36.7 C) (Oral)   Resp 18   SpO2 95%  Physical Exam General: resting comfortably, NAD Lungs: normal WOB Psych: currently calm and resting  ED Course / MDM  EKG:EKG Interpretation  Date/Time:  Tuesday June 06 2021 11:54:02 EDT Ventricular Rate:  73 PR Interval:  150 QRS Duration: 86 QT Interval:  384 QTC Calculation: 423 R Axis:   47 Text Interpretation: Normal sinus rhythm Minimal voltage criteria for LVH, may be normal variant ( R in aVL ) Possible Lateral infarct , age undetermined Possible Inferior infarct , age undetermined Abnormal ECG No significant change since prior 11/21 Confirmed by Aletta Edouard [phone]) on 06/06/2021 12:20:44 PM  I have reviewed the labs performed to date as well as medications administered while in observation.  Recent changes in the last 24 hours include none.  Plan  Current plan is for placement back with the patient's original group home.  Patient now has a new contract for safety, disposition pending CSW.  Jordan Gray is not under involuntary commitment.     Jordan Gibbs, DO 06/08/21 0830
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
## 2021-06-08 NOTE — ED Notes (Signed)
Spoke with guardian Jordan Gray.  I explained that this pt is being d/c b/c of improved behavior, demeanor and symptom improvement.  Mr. Jordan Gray expressed concern that he is not going to receive inpatient care and stated that the team he works with had concerns about him coming home based on the concerns initially expressed by the pt and the fact that he has had 2 hospitalizations in the last 30 days according to Mr. Jordan Gray. I stated that I was not aware of that. I read him the EDP D/C note by Dr. Ashok Cordia and stated that my observations of his behavior and demeanor lined up with what the note said.  I stated that he essentially received inpatient care as far as medication is concerned here in the ED.    Mr. Jordan Gray is following up with his team who is responsible for [name] and will get back to me.

## 2021-06-08 NOTE — Progress Notes (Signed)
CSW left a message with the legal guaridan who stated he is on a conference call. CSW also spoke with the QP and group home who stated CSW will have to get approval from the legal guardian to send patient back.

## 2021-06-08 NOTE — ED Notes (Signed)
Patient resting in stretcher comfortably. Eyes closed, Equal chest rise and fall. Patient alert to verbal stimuli. Call bell in reach, Stretcher in low and locked position. Side rails up x2.   

## 2021-06-08 NOTE — Discharge Instructions (Addendum)
It was our pleasure to provide your ER care today - we hope that you feel better.  Follow up with your primary care doctor, and your behavioral health provider, in the next 1-2 weeks.   For behavioral health issues and/or crisis, you may also go to the [facility] - it is open 24/7 and walk-ins are welcome.   Return to ER if worse, new symptoms, fevers, trouble breathing, or other emergency concern.

## 2021-06-08 NOTE — ED Notes (Signed)
Breakfast Ordered 
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
## 2021-06-08 NOTE — Progress Notes (Signed)
CSW spoke with patients care coordinator Aviva Signs [phone]). CSW asked if she had another contact for patient guardian or someone above him from the agency. Ms. Edison Pace stated she believes the guardian is hesitate to have patient return to the group home because patient was hearing voices to harm himself. CSW explained patient is psych cleared and medication adjustments have been made. Ms. Edison Pace stated she does not believe the guaridan is avoiding CSW but does not agree with patient discharging. Ms. Edison Pace stated she would try to get in touch with legal guardian.

## 2021-06-08 NOTE — ED Notes (Signed)
Patient approached this RN c/o back pain. Patient requesting tylenol.

## 2021-06-08 NOTE — Social Work (Signed)
CSW called [facility] supervisor, Wells Guiles @ [phone] in an effort to connect with guardian [name]. Per Wells Guiles, Pt is cleared to return to [facility]  CSW then received call from guardian [place] stating that group home manager, Mitzi Hansen will be at ED to pick up Pt between 5:30 and 6 pm.  RN updated.

## 2021-06-08 NOTE — ED Notes (Signed)
Pt states he has no SI at this time.

## 2021-06-08 NOTE — ED Notes (Signed)
Spoke with guardian Elgy Redmon.  He provided the names Aviva Signs, [name], and Cordella Register as people we have permission to share pt info with if they called.

## 2021-06-08 NOTE — ED Notes (Addendum)
CSW got in contact with appropriate patient contact to get patient from hospital once patient is discharged. Patient expected to be picked up from facility by [place] guardian around 1700 today. Patient calm, cooperative at this time.

## 2021-06-08 NOTE — ED Notes (Addendum)
RN attempted to contact guardian and group home to get patient placed at group home. No answer from guardian or home. Social work at bedside assisting patient.

## 2021-06-08 NOTE — ED Provider Notes (Signed)
Emergency Medicine Observation Re-evaluation Note  LEMON VIEW is a 40 y.o. male, seen on rounds today.  Pt initially presented to the ED for complaints of hx schizoaffective disorder, agitated behavior, ?hallucinations.  This  AM, pt reports feeling improved, is calm, conversant, cooperative. Pt indicates is looking forward to returning to his home/group home. No physical c/o.   Physical Exam  BP 120/81 (BP Location: Right Arm)   Pulse 70   Temp 98.1 F (36.7 C) (Oral)   Resp 18   SpO2 95%  Physical Exam General: calm, cooperative.  Cardiac: regular rate Lungs: breathing comfortably Psych: calm, content appearing. Normal mood and affect. Pt does not appear acutely depressed. Pt is not currently responding to internal stimuli - no delusions or hallucinations.   ED Course / MDM    I have reviewed the labs performed to date as well as medications administered while in observation.  Recent changes in the last 24 hours include ED observation and reassessment.   Plan    RAPHEAL KACZYNSKI is not under involuntary commitment.  [facility] team has reassessed and indicates pt psych clear for discharge.   TOC/RN staff to notify group home and guardian of impending d/c and return to his group home.  Pt currently appears stable for d/c.       Lajean Saver, MD 06/08/21 1101

## 2021-06-08 NOTE — ED Notes (Signed)
Multiple attempts have been made to reach the guardian by myself and the pt.  I reached out to SW and they stated that they are trying to speak with him as well.  SW stated that guardian is in a conference call and is supposed to call her back after.   I did speak with Mitzi Hansen at the group home and told him the pt was doing much better and wants to leave.  He stated that the Guardian had to make that call.
